# Patient Record
Sex: Male | Born: 1935 | Race: White | Hispanic: No | Marital: Married | Smoking: Never smoker
Health system: Southern US, Community
[De-identification: ages and names within clinical notes are randomized; demographics above are authoritative.]

## PROBLEM LIST (undated history)

## (undated) DIAGNOSIS — Z7901 Long term (current) use of anticoagulants: Secondary | ICD-10-CM

## (undated) DIAGNOSIS — K56609 Unspecified intestinal obstruction, unspecified as to partial versus complete obstruction: Secondary | ICD-10-CM

## (undated) DIAGNOSIS — Z8679 Personal history of other diseases of the circulatory system: Secondary | ICD-10-CM

## (undated) DIAGNOSIS — E785 Hyperlipidemia, unspecified: Secondary | ICD-10-CM

## (undated) DIAGNOSIS — I2510 Atherosclerotic heart disease of native coronary artery without angina pectoris: Secondary | ICD-10-CM

## (undated) DIAGNOSIS — R634 Abnormal weight loss: Secondary | ICD-10-CM

## (undated) DIAGNOSIS — N184 Chronic kidney disease, stage 4 (severe): Secondary | ICD-10-CM

## (undated) DIAGNOSIS — J189 Pneumonia, unspecified organism: Secondary | ICD-10-CM

## (undated) DIAGNOSIS — I272 Pulmonary hypertension, unspecified: Secondary | ICD-10-CM

## (undated) DIAGNOSIS — I482 Chronic atrial fibrillation, unspecified: Secondary | ICD-10-CM

## (undated) DIAGNOSIS — I509 Heart failure, unspecified: Secondary | ICD-10-CM

## (undated) DIAGNOSIS — I499 Cardiac arrhythmia, unspecified: Secondary | ICD-10-CM

## (undated) DIAGNOSIS — D649 Anemia, unspecified: Secondary | ICD-10-CM

## (undated) DIAGNOSIS — I10 Essential (primary) hypertension: Secondary | ICD-10-CM

## (undated) HISTORY — DX: Chronic kidney disease, stage 4 (severe): N18.4

## (undated) HISTORY — PX: EXTERNAL EAR SURGERY: SHX627

## (undated) HISTORY — DX: Unspecified intestinal obstruction, unspecified as to partial versus complete obstruction: K56.609

## (undated) HISTORY — DX: Chronic atrial fibrillation, unspecified: I48.20

## (undated) HISTORY — DX: Abnormal weight loss: R63.4

## (undated) HISTORY — DX: Atherosclerotic heart disease of native coronary artery without angina pectoris: I25.10

## (undated) HISTORY — DX: Heart failure, unspecified: I50.9

## (undated) HISTORY — DX: Hyperlipidemia, unspecified: E78.5

## (undated) HISTORY — DX: Anemia, unspecified: D64.9

## (undated) HISTORY — PX: CHOLECYSTECTOMY: SHX55

## (undated) HISTORY — DX: Long term (current) use of anticoagulants: Z79.01

## (undated) HISTORY — DX: Personal history of other diseases of the circulatory system: Z86.79

## (undated) HISTORY — DX: Essential (primary) hypertension: I10

## (undated) HISTORY — DX: Pulmonary hypertension, unspecified: I27.20

---

## 1997-06-02 ENCOUNTER — Other Ambulatory Visit: Admission: RE | Admit: 1997-06-02 | Discharge: 1997-06-02 | Payer: Self-pay | Admitting: Family Medicine

## 1997-07-13 ENCOUNTER — Other Ambulatory Visit: Admission: RE | Admit: 1997-07-13 | Discharge: 1997-07-13 | Payer: Self-pay | Admitting: Family Medicine

## 1998-06-24 ENCOUNTER — Ambulatory Visit (HOSPITAL_COMMUNITY): Admission: RE | Admit: 1998-06-24 | Discharge: 1998-06-25 | Payer: Self-pay | Admitting: Ophthalmology

## 1998-06-24 ENCOUNTER — Encounter: Payer: Self-pay | Admitting: Ophthalmology

## 1999-01-03 HISTORY — PX: CORONARY ARTERY BYPASS GRAFT: SHX141

## 1999-03-31 ENCOUNTER — Ambulatory Visit (HOSPITAL_COMMUNITY): Admission: RE | Admit: 1999-03-31 | Discharge: 1999-03-31 | Payer: Self-pay | Admitting: Cardiology

## 1999-03-31 HISTORY — PX: CARDIAC CATHETERIZATION: SHX172

## 1999-04-07 ENCOUNTER — Encounter: Payer: Self-pay | Admitting: Surgery

## 1999-04-11 ENCOUNTER — Inpatient Hospital Stay (HOSPITAL_COMMUNITY): Admission: RE | Admit: 1999-04-11 | Discharge: 1999-04-19 | Payer: Self-pay | Admitting: Surgery

## 1999-04-11 ENCOUNTER — Encounter: Payer: Self-pay | Admitting: Surgery

## 1999-04-12 ENCOUNTER — Encounter: Payer: Self-pay | Admitting: Surgery

## 1999-04-13 ENCOUNTER — Encounter: Payer: Self-pay | Admitting: Surgery

## 2000-02-08 ENCOUNTER — Emergency Department (HOSPITAL_COMMUNITY): Admission: EM | Admit: 2000-02-08 | Discharge: 2000-02-08 | Payer: Self-pay

## 2000-02-08 ENCOUNTER — Encounter: Payer: Self-pay | Admitting: Emergency Medicine

## 2000-10-30 ENCOUNTER — Emergency Department (HOSPITAL_COMMUNITY): Admission: EM | Admit: 2000-10-30 | Discharge: 2000-10-30 | Payer: Self-pay | Admitting: Emergency Medicine

## 2003-04-23 ENCOUNTER — Emergency Department (HOSPITAL_COMMUNITY): Admission: EM | Admit: 2003-04-23 | Discharge: 2003-04-24 | Payer: Self-pay | Admitting: Emergency Medicine

## 2003-08-27 ENCOUNTER — Emergency Department (HOSPITAL_COMMUNITY): Admission: EM | Admit: 2003-08-27 | Discharge: 2003-08-28 | Payer: Self-pay | Admitting: Emergency Medicine

## 2003-09-21 ENCOUNTER — Ambulatory Visit: Payer: Self-pay | Admitting: Nurse Practitioner

## 2003-10-26 ENCOUNTER — Ambulatory Visit: Payer: Self-pay | Admitting: Nurse Practitioner

## 2003-11-30 ENCOUNTER — Ambulatory Visit: Payer: Self-pay | Admitting: Nurse Practitioner

## 2003-12-11 ENCOUNTER — Emergency Department (HOSPITAL_COMMUNITY): Admission: EM | Admit: 2003-12-11 | Discharge: 2003-12-11 | Payer: Self-pay | Admitting: Emergency Medicine

## 2003-12-30 ENCOUNTER — Ambulatory Visit: Payer: Self-pay | Admitting: Family Medicine

## 2004-01-27 ENCOUNTER — Ambulatory Visit: Payer: Self-pay | Admitting: Nurse Practitioner

## 2004-02-24 ENCOUNTER — Ambulatory Visit: Payer: Self-pay | Admitting: Nurse Practitioner

## 2004-03-23 ENCOUNTER — Ambulatory Visit: Payer: Self-pay | Admitting: Nurse Practitioner

## 2004-03-30 ENCOUNTER — Ambulatory Visit: Payer: Self-pay | Admitting: Family Medicine

## 2004-04-21 ENCOUNTER — Ambulatory Visit: Payer: Self-pay | Admitting: Nurse Practitioner

## 2004-04-26 ENCOUNTER — Ambulatory Visit: Payer: Self-pay | Admitting: Nurse Practitioner

## 2004-05-19 ENCOUNTER — Ambulatory Visit: Payer: Self-pay | Admitting: Nurse Practitioner

## 2004-06-20 ENCOUNTER — Ambulatory Visit: Payer: Self-pay | Admitting: Nurse Practitioner

## 2004-07-20 ENCOUNTER — Ambulatory Visit: Payer: Self-pay | Admitting: Nurse Practitioner

## 2004-07-26 ENCOUNTER — Ambulatory Visit: Payer: Self-pay | Admitting: Nurse Practitioner

## 2004-08-22 ENCOUNTER — Ambulatory Visit: Payer: Self-pay | Admitting: Nurse Practitioner

## 2004-08-29 ENCOUNTER — Ambulatory Visit: Payer: Self-pay | Admitting: Internal Medicine

## 2004-09-21 HISTORY — PX: US ECHOCARDIOGRAPHY: HXRAD669

## 2004-09-22 HISTORY — PX: CARDIOVASCULAR STRESS TEST: SHX262

## 2004-09-26 ENCOUNTER — Ambulatory Visit: Payer: Self-pay | Admitting: Internal Medicine

## 2004-10-13 ENCOUNTER — Ambulatory Visit: Payer: Self-pay | Admitting: Nurse Practitioner

## 2004-11-10 ENCOUNTER — Ambulatory Visit: Payer: Self-pay | Admitting: Nurse Practitioner

## 2004-11-23 ENCOUNTER — Ambulatory Visit: Payer: Self-pay | Admitting: Internal Medicine

## 2004-11-29 ENCOUNTER — Ambulatory Visit: Payer: Self-pay | Admitting: Nurse Practitioner

## 2004-12-14 ENCOUNTER — Ambulatory Visit: Payer: Self-pay | Admitting: Nurse Practitioner

## 2004-12-16 ENCOUNTER — Emergency Department (HOSPITAL_COMMUNITY): Admission: EM | Admit: 2004-12-16 | Discharge: 2004-12-16 | Payer: Self-pay | Admitting: Emergency Medicine

## 2004-12-22 ENCOUNTER — Ambulatory Visit: Payer: Self-pay | Admitting: Nurse Practitioner

## 2004-12-29 ENCOUNTER — Ambulatory Visit: Payer: Self-pay | Admitting: Internal Medicine

## 2005-01-09 ENCOUNTER — Ambulatory Visit: Payer: Self-pay | Admitting: Nurse Practitioner

## 2005-02-07 ENCOUNTER — Ambulatory Visit: Payer: Self-pay | Admitting: Nurse Practitioner

## 2005-03-07 ENCOUNTER — Ambulatory Visit: Payer: Self-pay | Admitting: Nurse Practitioner

## 2005-04-05 ENCOUNTER — Ambulatory Visit: Payer: Self-pay | Admitting: Nurse Practitioner

## 2005-04-26 ENCOUNTER — Ambulatory Visit: Payer: Self-pay | Admitting: Nurse Practitioner

## 2005-05-25 ENCOUNTER — Ambulatory Visit: Payer: Self-pay | Admitting: Family Medicine

## 2005-06-02 ENCOUNTER — Encounter (INDEPENDENT_AMBULATORY_CARE_PROVIDER_SITE_OTHER): Payer: Self-pay | Admitting: Nurse Practitioner

## 2005-06-02 LAB — CONVERTED CEMR LAB: PSA: 0.68 ng/mL

## 2005-06-22 ENCOUNTER — Ambulatory Visit: Payer: Self-pay | Admitting: Nurse Practitioner

## 2005-07-24 ENCOUNTER — Ambulatory Visit: Payer: Self-pay | Admitting: Nurse Practitioner

## 2005-07-31 ENCOUNTER — Ambulatory Visit: Payer: Self-pay | Admitting: Nurse Practitioner

## 2005-08-23 ENCOUNTER — Ambulatory Visit: Payer: Self-pay | Admitting: Nurse Practitioner

## 2005-09-20 ENCOUNTER — Ambulatory Visit: Payer: Self-pay | Admitting: Nurse Practitioner

## 2005-10-18 ENCOUNTER — Ambulatory Visit: Payer: Self-pay | Admitting: Nurse Practitioner

## 2005-11-15 ENCOUNTER — Ambulatory Visit: Payer: Self-pay | Admitting: Nurse Practitioner

## 2005-12-13 ENCOUNTER — Ambulatory Visit: Payer: Self-pay | Admitting: Nurse Practitioner

## 2006-01-15 ENCOUNTER — Ambulatory Visit: Payer: Self-pay | Admitting: Nurse Practitioner

## 2006-02-13 ENCOUNTER — Ambulatory Visit: Payer: Self-pay | Admitting: Nurse Practitioner

## 2006-03-14 ENCOUNTER — Ambulatory Visit: Payer: Self-pay | Admitting: Nurse Practitioner

## 2006-03-28 ENCOUNTER — Ambulatory Visit: Payer: Self-pay | Admitting: Nurse Practitioner

## 2006-04-26 ENCOUNTER — Ambulatory Visit: Payer: Self-pay | Admitting: Nurse Practitioner

## 2006-05-08 ENCOUNTER — Ambulatory Visit: Payer: Self-pay | Admitting: Nurse Practitioner

## 2006-06-05 ENCOUNTER — Ambulatory Visit: Payer: Self-pay | Admitting: Internal Medicine

## 2006-06-11 ENCOUNTER — Encounter (INDEPENDENT_AMBULATORY_CARE_PROVIDER_SITE_OTHER): Payer: Self-pay | Admitting: Nurse Practitioner

## 2006-06-11 DIAGNOSIS — I4891 Unspecified atrial fibrillation: Secondary | ICD-10-CM

## 2006-06-11 DIAGNOSIS — I2510 Atherosclerotic heart disease of native coronary artery without angina pectoris: Secondary | ICD-10-CM | POA: Insufficient documentation

## 2006-06-11 DIAGNOSIS — D509 Iron deficiency anemia, unspecified: Secondary | ICD-10-CM

## 2006-06-11 DIAGNOSIS — F4310 Post-traumatic stress disorder, unspecified: Secondary | ICD-10-CM | POA: Insufficient documentation

## 2006-06-11 DIAGNOSIS — I10 Essential (primary) hypertension: Secondary | ICD-10-CM | POA: Insufficient documentation

## 2006-06-11 DIAGNOSIS — Z951 Presence of aortocoronary bypass graft: Secondary | ICD-10-CM

## 2006-06-11 DIAGNOSIS — F411 Generalized anxiety disorder: Secondary | ICD-10-CM

## 2006-06-11 DIAGNOSIS — E7800 Pure hypercholesterolemia, unspecified: Secondary | ICD-10-CM

## 2006-06-12 ENCOUNTER — Ambulatory Visit: Payer: Self-pay | Admitting: Family Medicine

## 2006-06-19 ENCOUNTER — Ambulatory Visit: Payer: Self-pay | Admitting: Family Medicine

## 2006-07-03 ENCOUNTER — Ambulatory Visit: Payer: Self-pay | Admitting: Internal Medicine

## 2006-07-31 ENCOUNTER — Ambulatory Visit: Payer: Self-pay | Admitting: Nurse Practitioner

## 2006-08-28 ENCOUNTER — Ambulatory Visit: Payer: Self-pay | Admitting: Nurse Practitioner

## 2006-09-11 ENCOUNTER — Ambulatory Visit: Payer: Self-pay | Admitting: Nurse Practitioner

## 2006-09-12 ENCOUNTER — Ambulatory Visit: Payer: Self-pay | Admitting: Nurse Practitioner

## 2006-10-09 ENCOUNTER — Ambulatory Visit: Payer: Self-pay | Admitting: Nurse Practitioner

## 2006-11-08 ENCOUNTER — Ambulatory Visit: Payer: Self-pay | Admitting: Nurse Practitioner

## 2006-12-04 ENCOUNTER — Ambulatory Visit: Payer: Self-pay | Admitting: Nurse Practitioner

## 2007-01-01 ENCOUNTER — Ambulatory Visit: Payer: Self-pay | Admitting: Nurse Practitioner

## 2007-01-31 ENCOUNTER — Ambulatory Visit: Payer: Self-pay | Admitting: Family Medicine

## 2007-01-31 ENCOUNTER — Encounter (INDEPENDENT_AMBULATORY_CARE_PROVIDER_SITE_OTHER): Payer: Self-pay | Admitting: Nurse Practitioner

## 2007-01-31 LAB — CONVERTED CEMR LAB
Alkaline Phosphatase: 68 units/L (ref 39–117)
BUN: 21 mg/dL (ref 6–23)
Digitoxin Lvl: 0.8 ng/mL (ref 0.8–2.0)
Eosinophils Absolute: 0.3 10*3/uL (ref 0.0–0.7)
Eosinophils Relative: 4 % (ref 0–5)
HCT: 38.1 % — ABNORMAL LOW (ref 39.0–52.0)
LDL Cholesterol: 74 mg/dL (ref 0–99)
Lymphs Abs: 0.8 10*3/uL (ref 0.7–4.0)
Monocytes Absolute: 0.5 10*3/uL (ref 0.1–1.0)
Monocytes Relative: 8 % (ref 3–12)
Neutro Abs: 4.6 10*3/uL (ref 1.7–7.7)
Neutrophils Relative %: 75 % (ref 43–77)
PSA: 0.64 ng/mL (ref 0.10–4.00)
RBC: 3.94 M/uL — ABNORMAL LOW (ref 4.22–5.81)
Sodium: 142 meq/L (ref 135–145)
TSH: 1.919 microintl units/mL (ref 0.350–5.50)
Total CHOL/HDL Ratio: 3
Total Protein: 7.1 g/dL (ref 6.0–8.3)
VLDL: 18 mg/dL (ref 0–40)
WBC: 6.1 10*3/uL (ref 4.0–10.5)

## 2007-02-04 ENCOUNTER — Encounter (INDEPENDENT_AMBULATORY_CARE_PROVIDER_SITE_OTHER): Payer: Self-pay | Admitting: Nurse Practitioner

## 2007-02-04 LAB — CONVERTED CEMR LAB
Ferritin: 34 ng/mL (ref 22–322)
Iron: 59 ug/dL (ref 42–165)
Saturation Ratios: 17 % — ABNORMAL LOW (ref 20–55)
TIBC: 357 ug/dL (ref 215–435)
UIBC: 298 ug/dL

## 2007-02-27 ENCOUNTER — Ambulatory Visit: Payer: Self-pay | Admitting: Nurse Practitioner

## 2007-03-27 ENCOUNTER — Ambulatory Visit: Payer: Self-pay | Admitting: Internal Medicine

## 2007-04-09 ENCOUNTER — Ambulatory Visit: Payer: Self-pay | Admitting: Internal Medicine

## 2007-04-24 ENCOUNTER — Ambulatory Visit: Payer: Self-pay | Admitting: Nurse Practitioner

## 2007-05-08 ENCOUNTER — Ambulatory Visit: Payer: Self-pay | Admitting: Nurse Practitioner

## 2007-05-21 ENCOUNTER — Ambulatory Visit: Payer: Self-pay | Admitting: Internal Medicine

## 2007-05-21 ENCOUNTER — Encounter (INDEPENDENT_AMBULATORY_CARE_PROVIDER_SITE_OTHER): Payer: Self-pay | Admitting: Nurse Practitioner

## 2007-05-21 LAB — CONVERTED CEMR LAB
ALT: 14 units/L (ref 0–53)
BUN: 28 mg/dL — ABNORMAL HIGH (ref 6–23)
Basophils Absolute: 0.1 10*3/uL (ref 0.0–0.1)
CO2: 29 meq/L (ref 19–32)
Calcium: 9.3 mg/dL (ref 8.4–10.5)
Chloride: 103 meq/L (ref 96–112)
Creatinine, Ser: 1.8 mg/dL — ABNORMAL HIGH (ref 0.40–1.50)
Eosinophils Absolute: 0.4 10*3/uL (ref 0.0–0.7)
Eosinophils Relative: 6 % — ABNORMAL HIGH (ref 0–5)
HDL: 52 mg/dL (ref >39)
LDL Cholesterol: 77 mg/dL (ref 0–99)
MCV: 98.8 fL (ref 78.0–100.0)
Monocytes Relative: 10 % (ref 3–12)
Total Protein: 7.6 g/dL (ref 6.0–8.3)
Triglycerides: 85 mg/dL (ref <150)
VLDL: 17 mg/dL (ref 0–40)

## 2007-05-31 HISTORY — PX: US ECHOCARDIOGRAPHY: HXRAD669

## 2007-06-02 ENCOUNTER — Emergency Department (HOSPITAL_COMMUNITY): Admission: EM | Admit: 2007-06-02 | Discharge: 2007-06-02 | Payer: Self-pay | Admitting: Emergency Medicine

## 2007-06-06 ENCOUNTER — Ambulatory Visit: Payer: Self-pay | Admitting: Internal Medicine

## 2007-06-10 ENCOUNTER — Ambulatory Visit: Payer: Self-pay | Admitting: Internal Medicine

## 2007-06-19 ENCOUNTER — Ambulatory Visit: Payer: Self-pay | Admitting: Nurse Practitioner

## 2007-07-17 ENCOUNTER — Ambulatory Visit: Payer: Self-pay | Admitting: Internal Medicine

## 2007-08-14 ENCOUNTER — Ambulatory Visit: Payer: Self-pay | Admitting: Internal Medicine

## 2007-09-10 ENCOUNTER — Ambulatory Visit: Payer: Self-pay | Admitting: Family Medicine

## 2007-09-10 LAB — CONVERTED CEMR LAB
INR: 2.6 — ABNORMAL HIGH (ref 0.0–1.5)
Prothrombin Time: 30 s — ABNORMAL HIGH (ref 11.6–15.2)

## 2007-10-08 ENCOUNTER — Encounter (INDEPENDENT_AMBULATORY_CARE_PROVIDER_SITE_OTHER): Payer: Self-pay | Admitting: Family Medicine

## 2007-10-08 ENCOUNTER — Ambulatory Visit: Payer: Self-pay | Admitting: Internal Medicine

## 2007-11-05 ENCOUNTER — Ambulatory Visit: Payer: Self-pay | Admitting: Family Medicine

## 2007-11-05 LAB — CONVERTED CEMR LAB
INR: 2.8 — ABNORMAL HIGH (ref 0.0–1.5)
Prothrombin Time: 31.9 s — ABNORMAL HIGH (ref 11.6–15.2)

## 2007-12-03 ENCOUNTER — Ambulatory Visit: Payer: Self-pay | Admitting: Internal Medicine

## 2007-12-03 LAB — CONVERTED CEMR LAB: Prothrombin Time: 30.5 s — ABNORMAL HIGH (ref 11.6–15.2)

## 2007-12-16 ENCOUNTER — Ambulatory Visit: Payer: Self-pay | Admitting: Cardiology

## 2007-12-16 ENCOUNTER — Inpatient Hospital Stay (HOSPITAL_COMMUNITY): Admission: EM | Admit: 2007-12-16 | Discharge: 2007-12-24 | Payer: Self-pay | Admitting: Emergency Medicine

## 2007-12-17 ENCOUNTER — Encounter: Payer: Self-pay | Admitting: Cardiology

## 2007-12-17 HISTORY — PX: TRANSTHORACIC ECHOCARDIOGRAM: SHX275

## 2008-02-10 ENCOUNTER — Observation Stay (HOSPITAL_COMMUNITY): Admission: EM | Admit: 2008-02-10 | Discharge: 2008-02-11 | Payer: Self-pay | Admitting: Emergency Medicine

## 2008-02-10 ENCOUNTER — Encounter (INDEPENDENT_AMBULATORY_CARE_PROVIDER_SITE_OTHER): Payer: Self-pay | Admitting: Adult Health

## 2008-02-10 ENCOUNTER — Ambulatory Visit: Payer: Self-pay | Admitting: Internal Medicine

## 2008-02-10 LAB — CONVERTED CEMR LAB
AST: 27 units/L (ref 0–37)
Albumin: 4.2 g/dL (ref 3.5–5.2)
BUN: 63 mg/dL — ABNORMAL HIGH (ref 6–23)
CO2: 22 meq/L (ref 19–32)
Calcium: 10 mg/dL (ref 8.4–10.5)
Chloride: 103 meq/L (ref 96–112)
Creatinine, Ser: 2.72 mg/dL — ABNORMAL HIGH (ref 0.40–1.50)
Glucose, Bld: 108 mg/dL — ABNORMAL HIGH (ref 70–99)

## 2009-01-07 ENCOUNTER — Ambulatory Visit: Payer: Self-pay | Admitting: Internal Medicine

## 2009-01-07 ENCOUNTER — Encounter (INDEPENDENT_AMBULATORY_CARE_PROVIDER_SITE_OTHER): Payer: Self-pay | Admitting: Family Medicine

## 2009-01-07 LAB — CONVERTED CEMR LAB
BUN: 35 mg/dL — ABNORMAL HIGH (ref 6–23)
Basophils Relative: 1 % (ref 0–1)
CO2: 23 meq/L (ref 19–32)
Calcium: 8.7 mg/dL (ref 8.4–10.5)
Chloride: 108 meq/L (ref 96–112)
Creatinine, Ser: 2.06 mg/dL — ABNORMAL HIGH (ref 0.40–1.50)
Glucose, Bld: 61 mg/dL — ABNORMAL LOW (ref 70–99)
Hemoglobin: 11.1 g/dL — ABNORMAL LOW (ref 13.0–17.0)
Lymphs Abs: 1.1 10*3/uL (ref 0.7–4.0)
Monocytes Absolute: 0.5 10*3/uL (ref 0.1–1.0)
RBC: 3.41 M/uL — ABNORMAL LOW (ref 4.22–5.81)
TIBC: 283 ug/dL (ref 215–435)
WBC: 5.4 10*3/uL (ref 4.0–10.5)

## 2009-01-11 ENCOUNTER — Encounter (INDEPENDENT_AMBULATORY_CARE_PROVIDER_SITE_OTHER): Payer: Self-pay | Admitting: Family Medicine

## 2009-01-11 LAB — CONVERTED CEMR LAB: Insulin: 3 microintl units/mL (ref 3–28)

## 2009-01-18 ENCOUNTER — Ambulatory Visit: Payer: Self-pay | Admitting: Family Medicine

## 2009-02-08 ENCOUNTER — Ambulatory Visit: Payer: Self-pay | Admitting: Internal Medicine

## 2009-02-08 ENCOUNTER — Encounter (INDEPENDENT_AMBULATORY_CARE_PROVIDER_SITE_OTHER): Payer: Self-pay | Admitting: Family Medicine

## 2009-02-08 LAB — CONVERTED CEMR LAB: Erythropoietin: 26.7 milliintl units/mL (ref 2.6–34.0)

## 2009-02-16 ENCOUNTER — Ambulatory Visit (HOSPITAL_COMMUNITY): Admission: RE | Admit: 2009-02-16 | Discharge: 2009-02-16 | Payer: Self-pay | Admitting: Family Medicine

## 2009-06-11 ENCOUNTER — Ambulatory Visit: Payer: Self-pay | Admitting: Infectious Diseases

## 2009-06-11 ENCOUNTER — Encounter: Payer: Self-pay | Admitting: Internal Medicine

## 2009-06-11 ENCOUNTER — Inpatient Hospital Stay (HOSPITAL_COMMUNITY): Admission: EM | Admit: 2009-06-11 | Discharge: 2009-06-14 | Payer: Self-pay | Admitting: Emergency Medicine

## 2009-06-14 ENCOUNTER — Encounter: Payer: Self-pay | Admitting: Internal Medicine

## 2009-06-14 DIAGNOSIS — K56609 Unspecified intestinal obstruction, unspecified as to partial versus complete obstruction: Secondary | ICD-10-CM | POA: Insufficient documentation

## 2009-06-14 DIAGNOSIS — R634 Abnormal weight loss: Secondary | ICD-10-CM | POA: Insufficient documentation

## 2009-06-22 ENCOUNTER — Ambulatory Visit: Payer: Self-pay | Admitting: Internal Medicine

## 2009-06-22 LAB — CONVERTED CEMR LAB
Eosinophils Relative: 6 % — ABNORMAL HIGH (ref 0–5)
Hemoglobin: 10.9 g/dL — ABNORMAL LOW (ref 13.0–17.0)
Iron: 83 ug/dL (ref 42–165)
Lymphs Abs: 1.2 10*3/uL (ref 0.7–4.0)
MCHC: 31.7 g/dL (ref 30.0–36.0)
MCV: 98.9 fL (ref 78.0–100.0)
Neutrophils Relative %: 61 % (ref 43–77)
Platelets: 160 10*3/uL (ref 150–400)
RBC: 3.48 M/uL — ABNORMAL LOW (ref 4.22–5.81)
RDW: 15.5 % (ref 11.5–15.5)
TIBC: 281 ug/dL (ref 215–435)

## 2009-07-28 ENCOUNTER — Ambulatory Visit: Payer: Self-pay | Admitting: Internal Medicine

## 2009-08-04 ENCOUNTER — Ambulatory Visit: Payer: Self-pay | Admitting: Cardiology

## 2009-08-18 ENCOUNTER — Ambulatory Visit: Payer: Self-pay | Admitting: Cardiology

## 2009-09-24 ENCOUNTER — Ambulatory Visit: Payer: Self-pay | Admitting: Cardiology

## 2009-10-22 ENCOUNTER — Ambulatory Visit: Payer: Self-pay | Admitting: Cardiology

## 2009-11-26 ENCOUNTER — Ambulatory Visit: Payer: Self-pay | Admitting: Cardiovascular Disease

## 2009-12-29 ENCOUNTER — Ambulatory Visit: Payer: Self-pay | Admitting: Cardiology

## 2010-01-13 ENCOUNTER — Ambulatory Visit: Payer: Self-pay | Admitting: Cardiology

## 2010-02-03 NOTE — Miscellaneous (Signed)
Summary: Office Visit ([facility])    Visit Type:  f/u visit   History of Present Illness: Was told by Cardiologist that he had low hemoglobin.  Pt has not been here for 11 months. In the meantime, he has seen Cardiology- Dr. Peter [place] and here for Coumadin checks.       Allergies: No Known Drug Allergies    Physical Exam  General:  Well-developed,well-nourished,in no acute distress; alert,appropriate and cooperative throughout examination Eyes:  No corneal or conjunctival inflammation noted. EOMI. Perrla.  Ears:  External ear exam shows no significant lesions or deformities.  Otoscopic examination reveals clear canals, tympanic membranes are intact bilaterally without bulging, retraction, inflammation or discharge. Hearing is grossly normal bilaterally. Nose:  External nasal examination shows no deformity or inflammation. Nasal mucosa are pink and moist without lesions or exudates. Mouth:  Oral mucosa and oropharynx without lesions or exudates.   Lungs:  Normal respiratory effort, chest expands symmetrically. Lungs are clear to auscultation, no crackles or wheezes. Heart:  Normal rate and regular rhythm. S1 and S2 normal without gallop, murmur, click, rub or other extra sounds. Abdomen:  Bowel sounds positive,abdomen soft and non-tender without masses, organomegaly or hernias noted.   Impression & Recommendations:  Problem # 1:  ANEMIA, IRON DEFICIENCY NOS (ICD-280.9) h/o. Currently here due to cardiologist told him to make appt due to anemia. Is chronically on coumadin. No bleeding noted. Anemia panel, CBC, Hemoccult x3. Obtain cardiology records.  Complete Medication List: 1)  Coumadin Tabs (Warfarin sodium tabs) .... Dose per pharmacy 2)  Atenolol 50 Mg Tabs (Atenolol) .Marland Kitchen.. 1 by mouth once daily 3)  Zocor 20 Mg Tabs (Simvastatin) .Marland Kitchen.. 1 by mouth once daily 4)  Digitek 0.125 Mg Tabs (Digoxin) .Marland Kitchen.. 1 by mouth once daily 5)  Monopril 20 Mg Tabs (Fosinopril sodium)  .Marland Kitchen.. 1 by mouth once daily 6)  Furosemide 20 Mg Tabs (Furosemide) .Marland Kitchen.. 1 by mouth once daily 7)  Lorazepam 0.5 Mg Tabs (Lorazepam) .Marland Kitchen.. 1 by mouth three times a day as needed for anxiety 8)  Clonidine Hcl 0.2 Mg Tabs (Clonidine hcl) .Marland Kitchen.. 1 by mouth two times a day 9)  Famotidine 20 Mg Tabs (Famotidine) .Marland Kitchen.. 1 by mouth once daily 10)  Multivitamin/iron Tabs (Multiple vitamins-iron) .... Once daily 11)  Ambien 5 Mg Tabs (Zolpidem tartrate) .Marland Kitchen.. 1 by mouth at bedtime as needed for sleep

## 2010-02-03 NOTE — Discharge Summary (Signed)
Summary: Hospital Discharge Update    Hospital Discharge Update:  Date of Admission: 06/11/2009 Date of Discharge: 06/14/2009  Brief Summary:  Patient is 77 yo man from [place] with limited english vocab with pmh of abdominal surgery in 1980 for ulcer perforation. He was admitted with nausea and severe abdominal pain which was thought to be due to SBO. He did not have any episodes of vomiting in the hospital and started accepting oral diet and without anydifficulty at discharge. He also has a h/o 20 kg weight loss which is documented by his wife with severe loss of appetite. Since his suspected obstruction was relieved we will discharge him today with an out patient follow up with GI. He will have to make an appointment with [facility], I called Dr Elnoria Howard and he did not schedule an appointment without patinet's contact number and insurance details before making an appointment.  Labs needed at follow-up: PT/INR  Other labs needed at follow-up: fecal occult blood test  Other follow-up issues:  patient has a history of 20 kg/40 pounds undocumented weight loss which needs to followed up with appropriate tests and possible GI worup.  Problem list changes:  Added new problem of LOSS OF WEIGHT (ICD-783.21) Added new problem of UNSPECIFIED INTESTINAL OBSTRUCTION (ICD-560.9)  The medication, problem, and allergy lists have been updated.  Please see the dictated discharge summary for details.   Other patient instructions:  Follow with Dr Reche Dixon on 06/22/2009, Tuesday at 9.15 AM. He is requested to set up an appointment for him in GI for evaluation of his dramatic weight loss. We tried to call [facility] and talked to Dr Elnoria Howard who is accepting unassigned patients this week but will not schedule an appointment without his contact and insurance details. I would ask the patient to call GI office at [phone] and make an appointment.

## 2010-02-03 NOTE — Miscellaneous (Signed)
Summary: Hospital Admission  INTERNAL MEDICINE ADMISSION HISTORY AND PHYSICAL  Attending: Dr, Lina Sayre R1: Dr. Eben Burow [phone]          R2: Dr. [phone]  PCP: [facility] (Dr. Reche Dixon)  CC: Abdominal pain, N/V  HPI: 77 y/o male with pmh significant for CAD (s/p CABG), A. fib, HTN, HLD and CRI; who comes to the ED complaining of abdominal pain and nausea. Patient reports started around 2:30 am, constant, no radiation, worsen by movement and relieved by morphine/fentanyl given in the ED. Patient denies vomiting, no diarrhea, last bowel movement day PTA (normal); Patient reports decrease appetite and also weight loss (45 pounds over 2 years). Patient has never been follow by GI and has hx of perforated peptic ulcer s/p surgery in 1980. CT of abdomen in the ED demonstrated mild distal small bowel dilatation and possible SBO.  Patient denies fever, chills, hematochezia, melena, cough, SOB, chest pain or any other complaints.  ALLERGIES: NKDA  PAST MEDICAL HISTORY: Patient does not speak [place] (from [place]). Hx of CAD s/p CABG A. fibrilation (on coumadin) HTN HLD Renal insuficiency (Cr baseline 2.1) GERD Hx of perforated PUD (s/p surgery in 1980) Hard of hearing  Anxiety Insomnia  MEDICATIONS: COUMADIN  TABS (WARFARIN SODIUM TABS) dose per pharmacy ATENOLOL 50 MG TABS (ATENOLOL) 1 by mouth once daily ZOCOR 20 MG TABS (SIMVASTATIN) 1 by mouth once daily DIGITEK 0.125 MG TABS (DIGOXIN) 1 by mouth once daily MONOPRIL 20 MG TABS (FOSINOPRIL SODIUM) 1 by mouth once daily FUROSEMIDE 20 MG TABS (FUROSEMIDE) 1 by mouth once daily LORAZEPAM 0.5 MG TABS (LORAZEPAM) 1 by mouth three times a day as needed for anxiety CLONIDINE HCL 0.2 MG TABS (CLONIDINE HCL) 1 by mouth two times a day FAMOTIDINE 20 MG TABS (FAMOTIDINE) 1 by mouth once daily MULTIVITAMIN/IRON  TABS (MULTIPLE VITAMINS-IRON) once daily AMBIEN 5 MG TABS (ZOLPIDEM TARTRATE) 1 by mouth at bedtime as needed for sleep   SOCIAL  HISTORY: Currently living with his wife at home (but wife reports she is unable to provide care on her own) Retired (was a Art gallery manager) Substance GNF:AOZHYQ illicit drugs and ETOH. Patient quit smoking in 1992. Insurance:Medicare  FAMILY HISTORY No contributory at his age; but no hx of stomach cancer in his family.   ROS: negative except for findings on HPI.  VITALS: T:  97.5    P: 52     BP: 134/60    R:20     O2SAT:97%    ON:RA  PHYSICAL EXAM: General:  alert, cachetic, and cooperative to examination.   Head:  normocephalic and atraumatic.   Eyes:  vision grossly intact, pupils equal, pupils round, pupils reactive to light, no injection and anicteric.   Mouth:  pharynx pink and moist, no erythema, and no exudates.   Neck:  supple, full ROM, no thyromegaly, no JVD, and no carotid bruits.   Lungs:  normal respiratory effort, no accessory muscle use, normal breath sounds, no crackles, and no wheezes.  Heart:  Irregular rhythm, normal rate, no M/G/R.   Abdomen:  soft, mild mid abdomen tenderness, decreased bowel sounds, no distention, no guarding, no rebound, no visceromegaly appreciated; positve bilat inguinal hernias reducible & non tender.   Msk:  no joint swelling, no joint warmth, and no redness over joints.   Pulses:  2+ DP/PT pulses bilaterally Extremities:  No cyanosis, clubbing, edema  Skin: No rash.  Neurologic: alert and oriented X3, hard of hearing, poor effort due to generalized weakness (MS 4/5 bilaterally), normal  sensation.    LABS: Lipase                                   28                11-59            U/L  Digoxin                                  1.3               0.8-2.0          ng/mL  Protime ( Prothrombin Time)              24.5       h      11.6-15.2         INR                                      2.23       h      0.00-1.49    Sodium (NA)                              139               135-145          mEq/L  Potassium (K)                            4.6                3.5-5.1          mEq/L  Chloride                                 106               96-112           mEq/L  CO2                                      26                19-32            mEq/L  Glucose                                  123        h      70-99            mg/dL  BUN                                      33         h      6-23             mg/dL  Creatinine  2.44       h      0.4-1.5          mg/dL  GFR, Est Non African American            26         l      >60              mL/min  GFR, Est African American                31         l      >60              mL/min  Bilirubin, Total                         1.3        h      0.3-1.2          mg/dL  Alkaline Phosphatase                     51                39-117           U/L  SGOT (AST)                               23                0-37             U/L  SGPT (ALT)                               18                0-53             U/L  Total  Protein                           7.2               6.0-8.3          g/dL  Albumin-Blood                            3.8               3.5-5.2          g/dL  Calcium                                  8.9               8.4-10.5         mg/dL     WBC                                      6.9               4.0-10.5         K/uL  RBC  3.40       l      4.22-5.81        MIL/uL  Hemoglobin (HGB)                         11.4       l      13.0-17.0        g/dL  Hematocrit (HCT)                         34.4       l      39.0-52.0        %  MCV                                      101.2      h      78.0-100.0       fL  MCHC                                     33.2              30.0-36.0        g/dL  RDW                                      14.9              11.5-15.5        %  Platelet Count (PLT)                     126        l      150-400          K/uL  Neutrophils, %                           73                43-77             %  Lymphocytes, %                           17                12-46            %  Monocytes, %                             4                 3-12             %  Eosinophils, %                           5                 0-5              %  Basophils, %  0                 0-1              %  Neutrophils, Absolute                    5.0               1.7-7.7          K/uL  Lymphocytes, Absolute                    1.2               0.7-4.0          K/uL  Monocytes, Absolute                      0.3               0.1-1.0          K/uL  Eosinophils, Absolute                    0.4               0.0-0.7          K/uL  Basophils, Absolute                      0.0               0.0-0.1          K/uL    CKMB, POC                                <1.0       l      1.0-8.0          ng/mL  Troponin I, POC                          <0.05             0.00-0.09        ng/mL  Myoglobin, POC                           120               12-200           ng/mL     Color, Urine                             YELLOW            YELLOW  Appearance                               CLEAR             CLEAR  Specific Gravity                         1.020             1.005-1.030  pH  5.0               5.0-8.0  Urine Glucose                            NEGATIVE          NEG              mg/dL  Bilirubin                                NEGATIVE          NEG  Ketones                                  NEGATIVE          NEG              mg/dL  Blood                                    NEGATIVE          NEG  Protein                                  NEGATIVE          NEG              mg/dL  Urobilinogen                             1.0               0.0-1.0          mg/dL  Nitrite                                  NEGATIVE          NEG  Leukocytes                               NEGATIVE          NEG    MICROSCOPIC NOT DONE ON URINES WITH NEGATIVE PROTEIN, BLOOD, LEUKOCYTES,     NITRITE, OR GLUCOSE <1000 mg/dL.  IMAGES studies CT abd and pelvis: IMPRESSION:Probable distal small bowel obstruction, etiology indeterminate. The patient has small inguinal hernias.  On the right, small intestine does approached the mouth of this hernia but does not grossly anterior.  On the left, it appears that there is some fluid in the hernia but I cannot establish that small intestine enters the hernia.  Freely distributed ascites probably related to a small bowel obstruction.  Left pleural effusion with atelectasis in the left lower lobe.  2.2 cm low density abnormality at the dome of the liver likely represent a cyst or hemangioma.  This is not fully characterized however.  Small gallstones without imaging evidence of cholecystitis or obstruction.  3.1 cm infrarenal abdominal aortic aneurysm.    CXR: IMPRESSION:Left lower lobe atelectasis or infection with adjacent small left pleural effusion. Recommend radiographic follow-up until clearing. Underlying hyperinflation likely  relates to COPD. Cardiomegaly.  Abdominal x-ray: 1.  Mildly degraded exam due to lack of upright film and exclusion   of the low pelvis. 2.  Borderline small bowel dilatation with minimal small bowel air   fluid levels on decubitus imaging.  Suspect mild adynamic ileus. No specific evidence to suggest obstruction.  ASSESSMENT AND PLAN:  (1) Abdominal pain and nausea: Differential includes, SBO Vs malignancy (especially with weight loss) Vs H. Pylori infection, Vs biliary colic Vs adhesions. Surgery has been consulted, will follow her recommendations. Will check lactic acid, to r/o (even unlikely) mesenteric ischemia; will check stool and serology for H. pylori; will also check CEA and Ca 19-9. Will admit to a regular bed, NPO, follow LFT's (especially bilirubin) and also to repeat abdominal films in the am. Patient will benefit of EGD and also colonoscopy. Will provide supportive care, antiemetics,  pain killers and protonix.  (2) Weakness/weight loss: Could be secondary to poor appetite 2/2 abdominal condition going on; also secondary to malignancy. Will also check TSH, B12 and Vit D level to be thorough.  (3) Acute on chronic renal insuficiency: Will hydrate him, will discontinue ACE's inhibitor and will follow Cr level. Renal US done in February demonstarting chronic changes to his renal parenchyma that is most likely due to HTN.  (4) HTN: Stable and well controlled. Will continue same regimen for now, except for his lisinopril due to Cr in 2.4 range.  (5) A. Fib: Rate controlled and on coumadin. will continue coumadin but with a therapeutic goal of 1.7-1.8 anticipating GI workup and surgical exploration if needed. Will continue digoxin.  (6) HLD: Will continue Zocor daily and will check lipid profile.  (7) GERD: Will start protonix. Will check for H. pylori infection and will provide treatment if needed.  (8) Bilateral inguinal hernia: reducible, no incarcerated, will continue observation for now, no treatment required.  (9) Anxiety: Will continue as needed treatment with lorazepam as previously taken as an outpatient.  (10) VTE prophylaxis: Continue coumadin, early ambulation. If dose changes needed will start SCD's.

## 2010-02-08 ENCOUNTER — Encounter (INDEPENDENT_AMBULATORY_CARE_PROVIDER_SITE_OTHER): Payer: Medicare Other

## 2010-02-08 DIAGNOSIS — I4891 Unspecified atrial fibrillation: Secondary | ICD-10-CM

## 2010-02-08 DIAGNOSIS — Z7901 Long term (current) use of anticoagulants: Secondary | ICD-10-CM

## 2010-03-08 ENCOUNTER — Other Ambulatory Visit (INDEPENDENT_AMBULATORY_CARE_PROVIDER_SITE_OTHER): Payer: Medicare Other

## 2010-03-08 DIAGNOSIS — Z7901 Long term (current) use of anticoagulants: Secondary | ICD-10-CM

## 2010-03-08 DIAGNOSIS — I4891 Unspecified atrial fibrillation: Secondary | ICD-10-CM

## 2010-03-21 ENCOUNTER — Ambulatory Visit (INDEPENDENT_AMBULATORY_CARE_PROVIDER_SITE_OTHER): Payer: Medicare Other | Admitting: Cardiology

## 2010-03-21 DIAGNOSIS — I2510 Atherosclerotic heart disease of native coronary artery without angina pectoris: Secondary | ICD-10-CM

## 2010-03-21 DIAGNOSIS — Z951 Presence of aortocoronary bypass graft: Secondary | ICD-10-CM

## 2010-03-21 DIAGNOSIS — I4891 Unspecified atrial fibrillation: Secondary | ICD-10-CM

## 2010-03-21 DIAGNOSIS — I509 Heart failure, unspecified: Secondary | ICD-10-CM

## 2010-03-21 LAB — DIFFERENTIAL
Basophils Absolute: 0 10*3/uL (ref 0.0–0.1)
Lymphs Abs: 1.2 10*3/uL (ref 0.7–4.0)
Monocytes Absolute: 0.3 10*3/uL (ref 0.1–1.0)
Neutrophils Relative %: 73 % (ref 43–77)

## 2010-03-21 LAB — CBC
HCT: 30.4 % — ABNORMAL LOW (ref 39.0–52.0)
HCT: 30.4 % — ABNORMAL LOW (ref 39.0–52.0)
HCT: 31.5 % — ABNORMAL LOW (ref 39.0–52.0)
Hemoglobin: 10.3 g/dL — ABNORMAL LOW (ref 13.0–17.0)
Hemoglobin: 10.7 g/dL — ABNORMAL LOW (ref 13.0–17.0)
Hemoglobin: 11.4 g/dL — ABNORMAL LOW (ref 13.0–17.0)
MCHC: 33.2 g/dL (ref 30.0–36.0)
MCHC: 33.8 g/dL (ref 30.0–36.0)
MCHC: 33.9 g/dL (ref 30.0–36.0)
MCV: 101.2 fL — ABNORMAL HIGH (ref 78.0–100.0)
MCV: 96.3 fL (ref 78.0–100.0)
MCV: 96.5 fL (ref 78.0–100.0)
Platelets: 126 10*3/uL — ABNORMAL LOW (ref 150–400)
RBC: 3.14 MIL/uL — ABNORMAL LOW (ref 4.22–5.81)
RBC: 3.16 MIL/uL — ABNORMAL LOW (ref 4.22–5.81)
RBC: 3.26 MIL/uL — ABNORMAL LOW (ref 4.22–5.81)
RDW: 14.9 % (ref 11.5–15.5)
WBC: 5.4 10*3/uL (ref 4.0–10.5)
WBC: 6.7 10*3/uL (ref 4.0–10.5)

## 2010-03-21 LAB — POCT CARDIAC MARKERS
CKMB, poc: <1 ng/mL — ABNORMAL LOW (ref 1.0–8.0)
Myoglobin, poc: 120 ng/mL (ref 12–200)
Troponin i, poc: <0.05 ng/mL (ref 0.00–0.09)

## 2010-03-21 LAB — LIPID PANEL
Cholesterol: 124 mg/dL (ref 0–200)
LDL Cholesterol: 59 mg/dL (ref 0–99)
VLDL: 17 mg/dL (ref 0–40)

## 2010-03-21 LAB — BASIC METABOLIC PANEL
BUN: 25 mg/dL — ABNORMAL HIGH (ref 6–23)
CO2: 25 mEq/L (ref 19–32)
Calcium: 8.1 mg/dL — ABNORMAL LOW (ref 8.4–10.5)
Chloride: 111 mEq/L (ref 96–112)
GFR calc Af Amer: 37 mL/min — ABNORMAL LOW (ref >60)
GFR calc Af Amer: 38 mL/min — ABNORMAL LOW (ref >60)
Potassium: 4.2 mEq/L (ref 3.5–5.1)
Potassium: 4.5 mEq/L (ref 3.5–5.1)
Sodium: 137 mEq/L (ref 135–145)

## 2010-03-21 LAB — H. PYLORI ANTIBODY, IGG: H Pylori IgG: 0.8 {ISR}

## 2010-03-21 LAB — VITAMIN B12: Vitamin B-12: 671 pg/mL (ref 211–911)

## 2010-03-21 LAB — COMPREHENSIVE METABOLIC PANEL
ALT: 18 U/L (ref 0–53)
Albumin: 2.9 g/dL — ABNORMAL LOW (ref 3.5–5.2)
Alkaline Phosphatase: 44 U/L (ref 39–117)
BUN: 28 mg/dL — ABNORMAL HIGH (ref 6–23)
BUN: 33 mg/dL — ABNORMAL HIGH (ref 6–23)
CO2: 27 mEq/L (ref 19–32)
Calcium: 8.9 mg/dL (ref 8.4–10.5)
Chloride: 106 mEq/L (ref 96–112)
Chloride: 107 mEq/L (ref 96–112)
Creatinine, Ser: 2.09 mg/dL — ABNORMAL HIGH (ref 0.4–1.5)
Creatinine, Ser: 2.44 mg/dL — ABNORMAL HIGH (ref 0.4–1.5)
GFR calc Af Amer: 31 mL/min — ABNORMAL LOW (ref >60)
GFR calc non Af Amer: 31 mL/min — ABNORMAL LOW (ref >60)
Glucose, Bld: 76 mg/dL (ref 70–99)
Potassium: 4.8 mEq/L (ref 3.5–5.1)
Total Bilirubin: 1.2 mg/dL (ref 0.3–1.2)
Total Bilirubin: 1.3 mg/dL — ABNORMAL HIGH (ref 0.3–1.2)

## 2010-03-21 LAB — PROTIME-INR
INR: 2.78 — ABNORMAL HIGH (ref 0.00–1.49)
Prothrombin Time: 24.5 seconds — ABNORMAL HIGH (ref 11.6–15.2)

## 2010-03-21 LAB — LIPASE, BLOOD: Lipase: 28 U/L (ref 11–59)

## 2010-03-21 LAB — URINALYSIS, ROUTINE W REFLEX MICROSCOPIC
Bilirubin Urine: NEGATIVE
Hgb urine dipstick: NEGATIVE
Nitrite: NEGATIVE
Urobilinogen, UA: 1 mg/dL (ref 0.0–1.0)

## 2010-03-21 LAB — TSH: TSH: 3.489 u[IU]/mL (ref 0.350–4.500)

## 2010-04-18 ENCOUNTER — Ambulatory Visit (INDEPENDENT_AMBULATORY_CARE_PROVIDER_SITE_OTHER): Payer: Medicare Other | Admitting: *Deleted

## 2010-04-18 DIAGNOSIS — I4891 Unspecified atrial fibrillation: Secondary | ICD-10-CM

## 2010-04-18 DIAGNOSIS — Z7901 Long term (current) use of anticoagulants: Secondary | ICD-10-CM

## 2010-04-19 LAB — CBC
HCT: 44.7 % (ref 39.0–52.0)
Hemoglobin: 14.8 g/dL (ref 13.0–17.0)
Platelets: 170 10*3/uL (ref 150–400)
WBC: 7.5 10*3/uL (ref 4.0–10.5)

## 2010-04-19 LAB — COMPREHENSIVE METABOLIC PANEL
Albumin: 4.1 g/dL (ref 3.5–5.2)
Alkaline Phosphatase: 54 U/L (ref 39–117)
BUN: 60 mg/dL — ABNORMAL HIGH (ref 6–23)
Chloride: 101 mEq/L (ref 96–112)
Glucose, Bld: 113 mg/dL — ABNORMAL HIGH (ref 70–99)
Potassium: 4.3 mEq/L (ref 3.5–5.1)
Total Bilirubin: 2.4 mg/dL — ABNORMAL HIGH (ref 0.3–1.2)

## 2010-04-19 LAB — URINALYSIS, ROUTINE W REFLEX MICROSCOPIC
Glucose, UA: NEGATIVE mg/dL
Specific Gravity, Urine: 1.013 (ref 1.005–1.030)
pH: 5.5 (ref 5.0–8.0)

## 2010-04-19 LAB — PROTIME-INR
INR: 3.3 — ABNORMAL HIGH (ref 0.00–1.49)
INR: 3.4 — ABNORMAL HIGH (ref 0.00–1.49)

## 2010-04-19 LAB — DIFFERENTIAL
Basophils Absolute: 0 10*3/uL (ref 0.0–0.1)
Basophils Relative: 0 % (ref 0–1)
Eosinophils Absolute: 0.2 10*3/uL (ref 0.0–0.7)
Monocytes Absolute: 0.7 10*3/uL (ref 0.1–1.0)
Neutro Abs: 4.8 10*3/uL (ref 1.7–7.7)

## 2010-05-17 ENCOUNTER — Ambulatory Visit (INDEPENDENT_AMBULATORY_CARE_PROVIDER_SITE_OTHER): Payer: Medicare Other | Admitting: *Deleted

## 2010-05-17 DIAGNOSIS — Z7901 Long term (current) use of anticoagulants: Secondary | ICD-10-CM

## 2010-05-17 DIAGNOSIS — I4891 Unspecified atrial fibrillation: Secondary | ICD-10-CM

## 2010-05-17 LAB — POCT INR: INR: 2.7

## 2010-05-17 NOTE — H&P (Signed)
NAMEADALBERT, ALBERTO                 ACCOUNT NO.:  [id number]   MEDICAL RECORD NO.:  [id number]          PATIENT TYPE:  OBV   LOCATION:  1407                         FACILITY:  [facility]   PHYSICIAN:  Della Goo, M.D. DATE OF BIRTH:  10-22-1924   DATE OF ADMISSION:  02/10/2008  DATE OF DISCHARGE:  02/11/2008                              HISTORY & PHYSICAL   HISTORY OF PRESENTING COMPLAINT:  [facility].   CHIEF COMPLAINT:  Poor appetite and weakness.   HISTORY OF PRESENT ILLNESS:  This is an 77 year old male who presents to  the emergency department with complaints of poor appetite and decreased  intake of food and liquids over the past 3 days.  The patient's wife is  at the bedside and gives the history of the illness.  The patient speaks  very little [place].  However with the assistance of his family, he  denies having any nausea, vomiting or diarrhea.  Denies having any  constipation.  His wife reports that his symptoms actually have been  more than a few weeks and has been worse over the past 3 days.  She  states that he has not had any fevers, chills or congestion symptoms.  He has had weakness and fatigue.  And the patient reports just losing  his appetite.Marland Kitchen  He denies having any chest pain or abdominal pain.   PAST MEDICAL HISTORY:  Significant for:  1. Atrial fibrillation.  2. Hypertension.  3. Coronary artery disease.  4. Type 2 diabetes mellitus.  5. Coagulopathy secondary to Coumadin therapy.   MEDICATIONS:  Clonidine, Coumadin, atenolol, simvastatin, digoxin,  Lasix, nitroglycerin, Pepcid, Imdur, and lorazepam p.r.n.   ALLERGIES:  No known drug allergies.   SOCIAL HISTORY:  The patient is married.  He is a nonsmoker, nondrinker.   FAMILY HISTORY:  Family history is noncontributory.   REVIEW OF SYSTEMS:  Pertinents mentioned above.   PHYSICAL EXAMINATION:  GENERAL:  This is an elderly 77 year old, large  male in discomfort, but no acute distress.  VITAL  SIGNS:  Initially temperature 97.4,  blood pressure 116/65.  Heart  rate 61, respirations 20, oxygen saturation 100% on 2 liters nasal  cannula oxygen.  HEENT:  Normocephalic, atraumatic.  Pupils equally round, reactive to  light.  Extraocular movements are intact.  There is no scleral injection  or conjunctival erythema or injection.  Funduscopic benign.  Nares are  patent bilaterally.  Oropharynx is clear.  Mucosa is dry.  NECK:  Neck is supple, full range of motion.  No thyromegaly,  adenopathy, jugular venous distention.  CARDIOVASCULAR:  Regular rate  and rhythm.  No murmurs, gallops or rubs.  LUNGS:  Clear to auscultation bilaterally.  ABDOMEN:  Positive bowel sounds.  Soft, nontender, nondistended.  There  is no hepatosplenomegaly.  EXTREMITIES:  Without cyanosis, clubbing or  edema.  NEUROLOGIC:  The patient is alert and oriented x3.  He has generalized  weakness but otherwise there are no focal deficits on examination.   LABORATORY STUDIES:  White blood cell count 7.5, hemoglobin 14.8,  hematocrit 44.7, platelets 170,000, MCV 96.5, neutrophils 64%  lymphocytes 24%.  Sodium 143, potassium 4.3, chloride 101, bicarb 28,  BUN 60, creatinine 2.97, and this is changed from previous.  Glucose is  113, lipase 30.  Urinalysis negative.  Protime 36.8, INR 3.4.  Digoxin  level 1.9.  The previous BUN and creatinine were 32 and 1.63 and that  was in December 2009.   ASSESSMENT:  An 77 year old male being admitted with:  1. Dehydration/acute renal failure with chronic renal insufficiency.  2. Anorexia, possible failure to thrive syndrome.  3. Paroxysmal atrial fibrillation.  4. Coagulopathy secondary to Coumadin therapy.   PLAN:  The patient will be admitted for 23-hour observation.  IV fluids  have been ordered for rehydration therapy and KUB will be ordered  initially and if this reveals abnormal results, further imaging studies  will be ordered.  The patient's home medications have  been verified and  continued.  His PT and INR will be monitored daily and the patient will  be placed on GI prophylaxis for now.  Further workup will ensue pending  results of the patient's studies and his clinical course.      Della Goo, M.D.  Electronically Signed     HJ/MEDQ  D:  02/11/2008  T:  02/12/2008  Job:  [id number]

## 2010-05-17 NOTE — Discharge Summary (Signed)
NAMETARAS, RASK NO.:  [id number]   MEDICAL RECORD NO.:  [id number]          PATIENT TYPE:  INP   LOCATION:  4742                         FACILITY:  [facility]   PHYSICIAN:  Peter M. Swaziland, M.D.  DATE OF BIRTH:  Aug 02, 1924   DATE OF ADMISSION:  12/16/2007  DATE OF DISCHARGE:  12/24/2007                               DISCHARGE SUMMARY   HISTORY OF PRESENT ILLNESS:  Blake Arias is an 77 year old white male with  known history of coronary artery disease.  He is status post coronary  artery bypass surgery in 2001.  He has had chronic atrial fibrillation,  he has been on chronic Coumadin therapy.  He has a history of severe  hypertension, hyperlipidemia, and chronic renal insufficiency.  He also  has a history of congestive heart failure due to diastolic dysfunction  with echocardiogram in May 2009 showing normal systolic function with  moderate LVH and severe pulmonary hypertension.  The patient presented  with a 2- to 3-day history of increasing symptoms of shortness of breath  associated with chest discomfort.  His discomfort and shortness of  breath progressed and became more severe and he presented to the  emergency room.  He was found to be in pulmonary edema at this time.  He  was admitted for further evaluation and treatment.  Of note, the patient  is from [place] and he was unable to communicate in [place] and required  interpreter for adequate evaluation.   For details of his past medical history, social history, family history,  and physical exam, please see admission history and physical.   LABORATORY DATA:  His chest x-ray showed cardiomegaly with pulmonary  edema and left greater than right pleural effusions.  Electrocardiogram  showed atrial fibrillation with controlled ventricular response.  He has  LVH by voltage.  There is left axis deviation, nonspecific  interventricular conduction delay.  There were lateral ST-T wave changes  consistent with  ischemia versus dig effect.   ADDITIONAL LABORATORY DATA:  White count was 5500, hemoglobin 11.5,  hematocrit 35.0, platelets 119,000.  Protime was 26.3 with an INR of  2.3, PTT was 40.  Initial point-of-care cardiac markers, CK-MB was 2.1  and troponin was less than 0.05.  Sodium is 143, potassium 4.2, chloride  110, CO2 of 28, glucose of 84, BUN 22, creatinine 1.53.  Dig level was  0.6.  Second set of point-of-care cardiac markers were negative.  Total  cholesterol is 128, triglycerides 62, HDL of 45, LDL of 71.  A1c is 5.5.  BNP level was 1456.  TSH was 3.143.  Dig level was 0.6.   HOSPITAL COURSE:  The patient was admitted to telemetry monitoring.  Since he was therapeutic on his Coumadin, we did not initiate heparin.  He was started on aspirin.  He subsequently ruled out for myocardial  infarction by serial cardiac enzymes and his ECG showed no acute changes  over baseline.  He was treated with IV diuresis with lysis with  excellent response, significant weight loss, and significantly negative  on fluid balance on initial 3 days.  He had an echocardiogram, which  showed mild-to-moderate left ventricular hypertrophy with normal  ejection fraction of 55-60%.  There was mild mitral insufficiency.  There was moderate left atrial enlargement.  He had right  ventricular/right atrial enlargement with evidence of severe pulmonary  hypertension with an estimated right ventricular systolic pressure of 82  mmHg.  The patient continued to feel better.  We reduced his Lasix to a  lower dose of 40 mg b.i.d. from initial high of 80 t.i.d.  He continued  to be significantly hypertensive and we added Norvasc and Cardura to his  medical therapy.  We held his Coumadin in anticipation of proceeding  with cardiac catheterization, but despite holding his Coumadin, his INR  remained elevated.  For several days, he was given a dose of vitamin K  and gradually his protime came down.  At this point although  he had  responded very well clinically, we started to see a rise in his BUN and  creatinine, which peaked with a BUN of 46 and creatinine 3.91.  As we  started seeing his creatinine rise, we discontinued his ACE inhibitor  and held his Lasix.  He was gently hydrated and over the remainder  course of his hospital stay, his BUN and creatinine returned to baseline  with a pre-discharge level of 32 for BUN and creatinine of 1.63.  Given  this acute decline in his renal function during his hospital stay, we  elected not to proceed with cardiac catheterization, it was felt that  this was too risky for him.  His symptoms of angina resolved with  treating his congestive heart failure and he ruled out for myocardial  infarction.  We therefore decided to treat him medically.  Also prior to  his acute decline in renal function, he did have an episode of  hypotension that responded with holding some of his blood pressure  medications including clonidine, lisinopril, and we discontinued the  Cardura.  As noted, the patient was gently hydrated as his renal  function returned to baseline.  He continued to do well from a  congestive heart failure standpoint.  His weight remained stable at 71.2  kg.  He had good urine output.  His oxygen saturations remained normal.  Followup chest x-ray on December 21, 2007, showed resolution of his  pulmonary edema.  He still had a modest left pleural effusion with a  right pleural effusion had resolved.  His BNP remained elevated at 1727.  We did resume his Coumadin and at the time of discharge, his protime was  23 with an INR of 1.9.  We did recheck a dig level during his acute  renal insufficiency and it was 1.1.  His heart rate remained controlled  throughout his hospital stay.  At this point, it was felt the patient  maximized benefit of hospital stay and he was discharged home in stable  condition.  The major changes in his medications were that we had added   antianginal therapy with Norvasc and isosorbide, we had reduced his  clonidine to once a day, and increased his oral Lasix dose to twice, and  he was discharged home on his prior Coumadin dose.   DISCHARGE DIAGNOSES:  1. Acute on chronic congestive heart failure due to acute diastolic      dysfunction.  2. Unstable angina, resolved with treating his congestive heart      failure.  3. Coronary disease status post coronary artery bypass grafting in  2001.  4. Chronic atrial fibrillation.  5. Acute-on-chronic renal insufficiency secondary to diuresis,      hypotension, and angiotensin-converting enzyme inhibitor.  Renal      function is now back to baseline.  6. Hyperlipidemia.  7. Severe hypertension.   DISCHARGE MEDICATIONS:  1. Coumadin 4 mg on Mondays, Wednesdays, Fridays, Saturdays, and      Sundays, 2 mg on Tuesdays and Thursdays.  2. Lanoxin 0.125 mg daily.  3. Atenolol 50 mg daily.  4. Zocor 20 mg daily.  5. Pepcid 20 mg daily.  6. Isosorbide mononitrate 60 mg daily.  7. Norvasc 5 mg daily.  8. Clonidine 0.2 mg only once a day.  9. Lasix 40 mg twice a day.  10.Nitroglycerin 0.4 mg sublingual p.r.n.   DISCHARGE INSTRUCTIONS:  He is instructed to stop his lisinopril.  The  patient was given instruction on low-sodium heart-healthy diet.  He was  given heart failure instructions including daily weight.  He is to  increase his activity.  He will have follow up with Dr. Swaziland in 2  weeks and will repeat BMET, BNP level, and INR at that time.   DISCHARGE STATUS:  Improved.           ______________________________  Peter M. Swaziland, M.D.     PMJ/MEDQ  D:  12/24/2007  T:  12/24/2007  Job:  [id number]   cc:   Melvern Banker

## 2010-05-17 NOTE — H&P (Signed)
NAMECORKY, BLUMSTEIN NO.:  [id number]   MEDICAL RECORD NO.:  [id number]          PATIENT TYPE:  INP   LOCATION:  4742                         FACILITY:  [facility]   PHYSICIAN:  Vernice Jefferson, MD          DATE OF BIRTH:  August 18, 1924   DATE OF ADMISSION:  12/16/2007  DATE OF DISCHARGE:                              HISTORY & PHYSICAL   PRIMARY CARDIOLOGIST:  Peter M. Swaziland, MD   CHIEF COMPLAINT:  Chest discomfort and dyspnea on exertion.   HISTORY OF PRESENT ILLNESS:  The patient is an 77 year old white male  with history of coronary artery bypass grafting in 2001 (status post 5-  vessel with LIMA to OM, saphenous vein graft to PDA x2, saphenous vein  graft to diagonal, and saphenous vein graft to posterolateral artery),  atrial fibrillation on Coumadin therapy, hypertension, hyperlipidemia,  who comes in with complains of increasing chest pain, reports that his  chest pain symptoms usually occur couple of times a week.  However, over  the past 2 or 3 days he has had increasing frequency and severity of the  symptoms.  Reports that he has had chest discomfort with shortness of  breath and diaphoresis that had a crescendo component earlier last night  and earlier this morning.  Reports that currently he is chest pain free  at the present time, has been following Dr. Swaziland since his CABG, but  due to his language barrier (the patient is a [place]), he is not able  to elicit a reliable history in terms of his anginal history.   PAST MEDICAL HISTORY:  1. Coronary artery disease as stated above.  2. Atrial fibrillation, on chronic Coumadin therapy.  3. Hyperlipidemia.  4. Hypertension.   SOCIAL HISTORY:  He lives in [place], [place], very little  [place].  Negative for smoking, alcohol, or drug use.   FAMILY HISTORY:  Reviewed.  Noncontributory of the patient's current  medical conditions.   CURRENT MEDICATIONS:  1. Coumadin 4 mg on Monday, Wednesday, Friday,  Saturday, and Sunday,      and 2 mg on Tuesday and Thursday.  2. Clonidine 0.2 mg p.o. b.i.d.  3. Atenolol 100 mg p.o. b.i.d.  4. Lanoxin 0.125 mg p.o. daily.  5. Lasix 40 mg p.o. daily.  6. Pepcid 20 mg a day.  7. Lisinopril unknown dosing.   ALLERGIES:  No known drug allergies.   REVIEW OF SYSTEMS:  Denies any fever, chills, nausea, vomiting,  diarrhea, or dysuria.  Rest of the 11-point review of systems negative  except for those dictated in the above HPI.   PHYSICAL EXAMINATION:  VITAL SIGNS:  Blood pressure 183/88, heart rate  65, and he is afebrile.  GENERAL:  Well-developed, well-nourished white male in no acute  distress.  HEENT:  Moist mucous membranes.  No scleral icterus or conjunctival  pallor.  NECK:  Supple.  Full range of motion.  Jugular venous pressure,  estimated approximately 8-10 cm of water.  CARDIOVASCULAR:  Irregularly irregular with 3/6 holosystolic murmur at  the apex.  CHEST:  Clear to auscultation bilaterally.  No wheezes, rales, or  rhonchi.  ABDOMEN: Soft, nontender, nondistended.  Normoactive bowel sounds.  EXTREMITIES:  Trace to 1+ pretibial edema.  NEUROLOGIC:  Nonfocal.  SKIN:  No rashes.  No lesions.  MUSCULOSKELETAL:  No joint deformity or effusions.   There is no chest x-ray performed in the ER.  EKG demonstrates atrial  fibrillation, left ventricular hypertrophy with repolarization  abnormality, and QRS widening.   LABORATORY DATA:  Significant for hemoglobin 11.5, platelets of 119, BUN  and creatinine 22 and 1.5.  Biomarkers are negative.  His INR 2.3 and  digoxin level of 0.6.   IMPRESSION:  1. Acute coronary syndrome, unstable angina.  2. Mitral regurgitation by exam.  3. Possible congestive failure, NYHA class II, III symptoms.  4. Atrial fibrillation, on chronic Coumadin therapy.   PLAN:  Admit the patient to telemetry given his INR 2.3 and bleeding  risk, we will not initiate heparin therapy at this time.  We will  initiate  aspirin, statin, continue his beta-blocker, although we will  hold for the morning for possibility of nuclear stress test in the  morning.  We will continue his home medication right now.  I will keep  n.p.o. for risk stratification in the morning.  We also ordered a 2-D  echo cardiogram in the morning and followup on portable chest x-ray.      Vernice Jefferson, MD  Electronically Signed     JT/MEDQ  D:  12/16/2007  T:  12/17/2007  Job:  [id number]

## 2010-05-17 NOTE — Discharge Summary (Signed)
NAMEFARID, GRIGORIAN                 ACCOUNT NO.:  [id number]   MEDICAL RECORD NO.:  [id number]          PATIENT TYPE:  OBV   LOCATION:  1407                         FACILITY:  [facility]   PHYSICIAN:  Richarda Overlie, MD       DATE OF BIRTH:  Apr 09, 1924   DATE OF ADMISSION:  02/10/2008  DATE OF DISCHARGE:  02/11/2008                               DISCHARGE SUMMARY   DISCHARGE DIAGNOSES:  1. Decreased oral intake, likely secondary to constipation/ileus.  2. Acute on chronic renal insufficiency.  3. Diastolic heart failure, stable.  4. Atrial fibrillation, rate controlled.  5. Supratherapeutic increasing international normalized ratio.  6. Coronary artery disease, stable.  7. Hypertension, stable.   SUBJECTIVE:  This is an 77 year old male with history of atrial  fibrillation, coronary artery disease and CABG in 2001, who presented to  the ER with a chief complaint of abdominal pain with poor oral intake  for the last 3 days.  The patient complained of right lower quadrant  pain and constipation as well.  He denied any nausea, vomiting, fever,  chills or rigors at the time of admission.  He was found to be  clinically dehydrated with mild elevation in his BUN and creatinine from  his baseline and was admitted for IV hydration.   HOSPITAL COURSE:  1. Abdominal pain.  The patient's liver function tests, as well as      lipase were found to be within normal limits.  He had an abdominal      series done that showed nonspecific bowel-gas pattern.  The patient      was found have scattered air and stool in the colon and non-      distended air filled loops of small bowel.  Could not rule out mild      ileus or gastroenteritis.  The patient did not have any evidence of      a urinary tract infection.  His pain remained stable during the      course of his stay.  He was admitted for IV hydration and his      abdominal pain was thought to be secondary to constipation and also      perhaps  gastroenteritis contributing to his poor oral intake.  The      patient was advised to hold his Lasix until February 17, 2008.  He      has been started on aggressive constipation protocol with Colace      and MiraLax.  The patient will continue he is discharged.  No      episodes of nausea or vomiting were seen.  The patient has also      been provided with some Zofran as needed for nausea.  2. Supratherapeutic INR.  The patient is on Coumadin for atrial      fibrillation and his INR at the time of admission was 3.4 and then      3.3.  His Coumadin was held during his hospitalization.  His      Coumadin dose is being readjusted to 2 mg on Tuesdays, Thursdays,  Saturdays and Sunday and 4 mg on Monday, Wednesday and Friday.  He      was advised to start his Coumadin on February 12, 2008.  3. History of diastolic heart failure.  The patient has remained      stable and the patient had no evidence of chest pain, shortness of      breath or any exacerbation.  He is being continued on his      cardiotropic medications.  His digoxin level was found to be      therapeutic.   DISPOSITION:  The patient's discharge plan was discussed with the help  of an interpreter, arranged by the case manager.   DISCHARGE INSTRUCTIONS:  1. A 2-gram sodium diet, oral fluids restricted up to 2 liters per      day, lactose-free.  2. Follow up with PCP, Dr. Swaziland in 5-7 days.   DISCHARGE MEDICATIONS:  1. Coumadin 2 mg Tuesday, Thursday, Saturday and Sunday and 4 mg on      Monday, Wednesday and Friday to be started on February 12, 2008.  2. Colace 200 mg p.o. q.12.  3. Zofran 4 mg p.o. q.6 h., p.r.n. for nausea.  4. MiraLax 17 grams p.o. daily p.r.n.  5. Hold Lasix until February 17, 2008.  6. Lanoxin 0.125 mg p.o. daily.  7. Atenolol 50 mg p.o. daily.  8. Zocor 20 mg p.o. daily.  9. Pepcid 20 mg daily.  10.Imdur 60 mg p.o. daily.  11.Norvasc 5 mg daily.  12.Clonidine 0.2 mg p.o. once a day.   13.Nitroglycerin 0.4 mg sublingual p.r.n.    Repeat the following labs; repeat INR on February 13, 2008.   Repeat BMET in 5-7 days.      Richarda Overlie, MD  Electronically Signed     NA/MEDQ  D:  02/11/2008  T:  02/11/2008  Job:  [id number]

## 2010-05-20 NOTE — Discharge Summary (Signed)
[place]  Patient:    Blake Arias, Blake Arias                          MRN: [id number] Adm. Date:  [id number] Disc. Date: 04/17/99 Attending:  Cleatrice Burke Dictator:   Marlowe Kays, P.A. CC:         Alleen Borne, M.D.             Peter M. Swaziland, M.D.                  Referring Physician Discharge [facility]  DATE OF BIRTH:  June 16, 1924.  DISCHARGE DIAGNOSES: 1. Three-vessel coronary artery disease, status post coronary artery bypass    graft. 2. Chronic atrial fibrillation on Coumadin therapy. 3. Hypertension. 4. Pleural epicardial rub, resolved. 5. High creatinine - acute renal insufficiency. 6. Constipation, resolved. 7. Ascending aortic atherosclerosis, per coronary artery bypass graft    observation.  PROCEDURES:  Status post coronary artery bypass graft x 5 with left internal mammary artery to the obtuse marginal, sequential saphenous vein graft to the proximal PD, to the distal PD, saphenous vein graft to the PLA and saphenous vein graft to the diagonal.  COMPLICATIONS:  Acute renal insufficiency secondary to poor fluid intake, improved.  MEDICATIONS: 1. Ultram 50-100 mg one p.o. q.4-6h. p.r.n. pain. 2. Atenolol 50 mg one p.o. q.d. 3. Digoxin 0.____ mg p.o. q.d. 4. Zocor 20 mg p.o. q.d. 5. Coumadin 2.5 mg p.o. q.d. or as directed per Dr. Swaziland. 6. Pepcid 20 mg p.o. b.i.d.  ALLERGIES:  ASPIRIN.  FOLLOW-UP:  Follow up with Dr. Donnie Aho or Dr. Swaziland two weeks after discharge and Dr. Laneta Simmers three weeks after discharge.  The patient will bring chest x-ray to Dr. Garen Grams office after discharge.  HISTORY OF PRESENT ILLNESS:  The patient is a pleasant 77 year old [place] white male who presented with crescendo anginal symptoms to the [facility] office. Cardiac catheterization on March 31, 1999, has shown severe three-vessel coronary artery disease.  There was no aortic insufficiency.  There was some evidence of ascending  arteriosclerosis with some calcification of the proximal root per aortic root angiography.  After review of the angiograms and examination of the patient, it was felt that CABG was the best treatment. Dr. Laneta Simmers discussed the risks and benefits involving the procedure and the patient agreed to continue.  He underwent elective CABG x 5 on April 11, 1999, by Dr. Laneta Simmers without complications.  After the procedure, he was sedated on the ventilation, afebrile, and his blood pressure was stable.  He was A paced at 90 beats per minute.  His urinary output was within normal limits.  His EKG later that afternoon showed sinus bradycardia, otherwise with no other abnormalities seen.  Later that afternoon, he remained stable per Dr. Garen Grams report.  On PO day No. 1, his T-max was 101, however, in the morning, it returned to 99.6.  His blood pressure was stable.  His urinary output was good and his weight was slowly returning to normal.  His CT was removed, as well as the Swan-Ganz catheter.  Pulmonary toilet and diuresis was to be continued. Later that day, he was transferred to the unit 2 [place] and in stable condition.  Because he was having a history of atrial fibrillation on admission, Coumadin was restarted.  Later that afternoon, he started to ambulate very slowly.  On April 13, 1999, PO day No. 2, he increased his  ambulation, although he took several stops to rest.  His saturation of oxygen remained at 93 room air after ambulation.  However, his renal function was slightly worsen than the previous day, with a creatinine of 2.2.  His urine output had decreased, and it was suspected that the patient did not have enough fluid intake.  He was to continue to be observed.  On April 14, 1999, PO day No. 2, his creatinine decreased to 1.9, and his BUN to 34.  However, he returned to his previous chronic atrial fibrillation state, so he was to continue on Coumadin therapy.  Lopressor was changed to  atenolol, for this medication was taken before the patient was admitted.  As well, he was started on digoxin.  On April 15, 1999, PO day No. 4, he was progressing well, he had a therapeutic INR of over 2.3.  He postop renal dysfunction was improving.  He was slightly constipated, so he was given a laxative for resolution of his constipation.  He continued his ambulation without any complications.  On April 16, 1999, his vital signs were stable.  He was afebrile.  He remained in atrial fibrillation.  His saturation of oxygen was within normal limits.  His weight was similar to the one preop.  His INR was therapeutic at 2.7.  His digoxin level was 1.0.  His physical examination was within normal limits. Lungs were clear to auscultation by laterally.  It is expected for the patient to be discharged on April 17, 1999, under stable conditions. DD:  04/16/99 TD:  04/16/99 Job: [id number]

## 2010-05-20 NOTE — Op Note (Signed)
[place]. [facility]  Patient:    Blake Arias, Blake Arias                          MRN: [id number] Proc. Date: 04/11/99 Adm. Date:  [id number] Attending:  Cleatrice Burke CC:         Alleen Borne, M.D.             Peter M. Sw[place]il[place], M.D.             Cath lab                           Operative Report  PREOPERATIVE DIAGNOSIS:  Severe three-vessel coronary artery disease with crescendo angina.  POSTOPERATIVE DIAGNOSIS:  Severe three-vessel coronary artery disease with crescendo angina.  OPERATIVE PROCEDURE:  Median sternotomy, extracorporeal circulation, coronary artery bypass graft surgery x 5 using a left internal mammary artery graft to the obtuse marginal branch of the left circumflex coronary artery, with saphenous vein graft to the diagonal branch of the left anterior descending, a sequential saphenous vein graft to the proximal and distal posterior descending coronary artery, and a saphenous vein graft to the posterolateral branch of the right coronary artery.  SURGEON:  Alleen Borne, M.D.  ASSISTANT:  Lissa Merlin, P.A.  ANESTHESIA:  General endotracheal.  CLINICAL HISTORY:  The patient is a 77 year old [place] gentleman, who presented with crescendo anginal symptoms.  Cardiac catheterization on March 31, 1999 showed severe three-vessel coronary artery disease.  The LAD had a focal area of approximately 30% narrowing proximally.  There was a medium to large first diagonal branch that had about 30% proximal and 95% midvessel stenosis.  The left circumflex had 70 to 80% diffuse proximal stenosis and a single large marginal branch that had 80% proximal stenosis.  The right coronary artery was a large dominant vessel that had 80 to 90% stenosis at the crux.  There was also a 50% distal stenosis.  The posterior descending branch itself had a 95% midvessel stenosis.  There was a large posterolateral branch with a 90% stenosis proximally.  Left ventricular  ejection fraction was 70%. Echocardiogram showed mild mitral valve prolapse without regurgitation.  The aortic valve appeared normal.  The aortic root angiography showed diffuse aortic ectasia and dilatation but no aneurysm formation.  There was no aortic insufficiency.  There was some evidence of ascending atherosclerosis with some calcification of the proximal root.  After review of the angiograms and examination of the patient, it was felt that coronary artery bypass surgery was the best treatment.  I discussed the operative procedure with him through an interpreter, including alternatives to surgery, benefits, and risks including bleeding, possible blood transfusion, infection, stroke, myocardial infarction, and death.  He understood and agreed to proceed.alternatives to  DESCRIPTION OF PROCEDURE:  The patient was taken to the operating room and placed on the table in supine position.  After induction of general endotracheal anesthesia, a Foley catheter was placed in the bladder using sterile technique.  Then the chest, abdomen and both lower extremities were prepped and draped in the usual sterile manner.  The chest was entered through a median sternotomy incision and the pericardium opened in the midline. Examination of the heart showed good ventricular contractility.  The ascending aorta was mildly enlarged.  There was significant aortic atherosclerosis form the midascending aorta outwards.  The proximal root appeared soft.  Then the left internal  mammary artery was harvested from the chest wall as a pedicle graft.  This was a medium caliber vessel with excellent blood flow through it.  At the same time, a segment of greater saphenous vein was harvested from the right leg. This vein was of small to medium caliber and good quality.  Then the patient was heparinized and when an adequate activated clotting time was achieved, the distal ascending aorta was cannulated using a 6.5 mm  aortic cannula for arterial inflow.  Venous outflow was achieved using a two-stage venous cannula through the right atrial appendage.  An antegrade cardioplegia and vent cannula was inserted into the aortic root.  The patient was placed on cardiopulmonary bypass and the distal coronary arteries identified.  The LAD was a large vessel that was intramyocardial in its proximal one half and then exited through the surface of the heart.  There was no visible distal disease in it.  The diagonal branch was diffusely diseased, but graftable.  The obtuse marginal branch was also diffusely diseased with plaque but felt to be graftable.  The right coronary artery gave off a posterior descending branch which was soft proximally but then severely diseased in its midportion.  There was also a large posterolateral branch that had some proximal disease in it.  Then the aorta was cross-clamped and 500 cc of cold blood antegrade cardioplegia was administered in the aortic root with quick arrest of the heart.  Systemic hypothermia to 20 degrees centigrade and topical hypothermia with iced saline was used.  A temperature probe was placed in the septum and an insulating pad in the pericardium.  The first distal anastomosis was performed to the proximal posterior descending coronary artery.  The internal diameter was about 2 mm.  The conduit used was a segment of greater saphenous vein and the anastomosis performed in a sequential side-to-side manner using  manner using continuous 7-0 Prolene suture.  The flow was measured through the graft and was excellent.  The second distal anastomosis was performed to the distal portion of the posterior descending artery.  The internal diameter was about 1.5 mm.  The conduit used was the same segment of greater saphenous vein.  The anastomosis was performed in a sequential end-to-side manner using continuous 7-0 Prolene suture.  The flow was measured through the graft  and was excellent.  Then  another dose of cardioplegia was given down this vein graft.  The third distal anastomosis was performed to the posterolateral branch of the right coronary artery.  The internal diameter was 1.75 mm.  The conduit used was a second segment of greater saphenous vein.  The anastomosis was performed in a end-to-side manner using continuous 7-0 Prolene suture.  The flow was measured through the graft and was excellent.  The fourth distal anastomosis was performed to the diagonal branch of the LAD. The internal diameter was about 1.5 mm.  The conduit used was a third segment of greater saphenous vein.  The anastomosis was performed in a end-to-side manner using continuous 7-0 Prolene suture.  The flow was again measured through the graft and was excellent.  Then another dose of cardioplegia was given down the vein grafts and in the aortic root.  The fifth distal anastomosis was performed to the obtuse marginal artery.  The internal diameter was 1.6 mm.  The conduit used was the left internal mammary artery  and this was brought through an opening in the left pericardium anterior to the phrenic nerve. It was anastomosed  to the LAD in end-to-side manner using continuous 8-0 Prolene suture.  The pedicle was tacked to the epicardium with 6-0 Prolene sutures.  Since the patient had significant ascending aortic atherosclerosis, I decided to perform the proximal anastomoses with the crossclamp on.  These three proximal vein graft anastomoses were performed in an end-to-side manner using continuous 6-0 Prolene suture.  The patient was rewarmed to 37 degrees centigrade.  The head was placed in Trendelenburg position.  The clamp was removed from the mammary pedicle.  There was rapid return of activity to the spontaneous return of sinus rhythm.  The proximal and distal anastomoses appeared hemostatic and the line of the grafts satisfactory.  Graft markers were placed around  the proximal anastomoses.  Two temporary right ventricular and right atrial pacing wires were placed and brought out through the skin.  When the patient had rewarmed to 37 degrees centigrade, he was weaned from cardiopulmonary bypass on low-dose dopamine.  Total bypass time was 110 minutes. Cardiac function appeared excellent with a cardiac output of 4.5L per minute.  Protamine was given and the venous and aortic cannulas were removed without difficulty.  Hemostasis was achieved.  Three chest tubes were placed with a tube in the posterior pericardium and one in the left pleural space and one in the anterior mediastinum.  The pericardium was reapproximated over the heart.  The sternum was closed with #6 stainless steel wires.  The fascia was closed with continuous #1 Vicryl suture.  Subcutaneous tissue was closed using continuous 2-0 Vicryl and the skin with 3-0 Vicryl subcuticular closure.  The lower extremity vein harvest site was closed in layers in a similar manner. The sponge, needle and instrument counts were correct according to the scrub nurse.  Dry sterile dressings were applied over the incisions and around the chest tubes which were hooked to Pleur-Evac suction.  The patient remained hemodynamically stable and was transported to the SICU in guarded but stable condition. DD:  04/11/99 TD:  04/12/99 Job: [id number]

## 2010-05-20 NOTE — Cardiovascular Report (Signed)
[place]. [facility]  Patient:    Blake Arias, Blake Arias                          MRN: [id number] Proc. Date: 03/31/99 Adm. Date:  [id number] Disc. Date: [id number] Attending:  [place], Peter Manning CC:         Alleen Borne, M.D.                        Cardiac Catheterization  INDICATIONS FOR PROCEDURE:  Patient is a 77 year old [place] male who presents ith crescendo angina.  He has history of severe hypercholesterolemia, chronic atrial fibrillation and hypertension.  He has a strong family of coronary disease. Access is via the right femoral artery using the standard Seldinger technique.  EQUIPMENT:  6 French 5 cm left Judkins catheter, 6 French left Amplatz I catheter, 6 French angled pigtail catheter, 6 French arterial sheath.  CONTRAST:  170 cc of Omnipaque.  MEDICATIONS:  Local anesthesia, 1% Xylocaine.  COMMENTARY:  Patient tolerated the procedure well without complications.  HEMODYNAMIC DATA:  Aortic pressure was 155/86, with a mean of 111 mmHg.  Left ventricular pressure was 150 with EDP of 22 mmHg.  ANGIOGRAPHIC DATA:  The left coronary artery arises and distributes normally. he left main coronary artery is normal.  The left anterior descending artery has a 30% narrowing in the proximal vessel.  There was a large first diagonal branch which has severe segmental 90% stenosis  proximally followed by 95% stenosis in the mid vessel. The left circumflex coronary artery has diffuse 70 to 80% stenosis proximally.  There is a large first obtuse marginal vessel which has an 80% stenosis proximally. The left circumflex coronary artery is then occluded after the first obtuse marginal vessels.  There are faint bridging collaterals to the distal circumflex.  The right coronary artery arises anteriorly and distributes normally.  It is a large vessel.  There is an 80 to 90% stenosis at the crux of the right coronary  artery.  There is a 50% stenosis in  the distal right coronary artery.  The posterior descending artery has a 95% stenosis in the mid vessel.  There is a large posterior lateral branch which has a 90% stenosis proximally.  Left ventricular angiography demonstrates normal left ventricular chamber size nd contractility with normal systolic function.  Ejection fraction is estimated at  70%.  There is mild mitral valve prolapse without regurgitation.  The aortic valve appears normal.  Aortic root angiography demonstrates diffuse aortic ectasia and dilatation. There is no aortic insufficiency seen.  FINAL INTERPRETATION: 1. Severe three-vessel obstructive atherosclerotic coronary artery disease. 2. Normal left ventricular function. 3. Aortic ectasia.  PLAN:  Based on these findings, would recommend coronary artery bypass surgery. DD:  03/31/99 TD:  04/01/99 Job: [id number]

## 2010-06-14 ENCOUNTER — Ambulatory Visit (INDEPENDENT_AMBULATORY_CARE_PROVIDER_SITE_OTHER): Payer: Medicare Other | Admitting: *Deleted

## 2010-06-14 DIAGNOSIS — Z7901 Long term (current) use of anticoagulants: Secondary | ICD-10-CM

## 2010-06-14 DIAGNOSIS — I4891 Unspecified atrial fibrillation: Secondary | ICD-10-CM

## 2010-07-12 ENCOUNTER — Ambulatory Visit (INDEPENDENT_AMBULATORY_CARE_PROVIDER_SITE_OTHER): Payer: Medicare Other | Admitting: *Deleted

## 2010-07-12 ENCOUNTER — Other Ambulatory Visit: Payer: Self-pay | Admitting: *Deleted

## 2010-07-12 DIAGNOSIS — I4891 Unspecified atrial fibrillation: Secondary | ICD-10-CM

## 2010-07-12 DIAGNOSIS — Z7901 Long term (current) use of anticoagulants: Secondary | ICD-10-CM

## 2010-07-12 LAB — POCT INR: INR: 3.1

## 2010-07-12 MED ORDER — LORAZEPAM 0.5 MG PO TABS: 0.5000 mg | ORAL_TABLET | Freq: Three times a day (TID) | ORAL | Status: AC

## 2010-07-12 MED ORDER — WARFARIN SODIUM 4 MG PO TABS: ORAL_TABLET | ORAL | Status: DC

## 2010-07-12 MED ORDER — WARFARIN SODIUM 2 MG PO TABS: ORAL_TABLET | ORAL | Status: DC

## 2010-07-12 NOTE — Telephone Encounter (Signed)
Was in for INR check and wanted refills on medications. Sent to [facility]

## 2010-07-25 ENCOUNTER — Ambulatory Visit (INDEPENDENT_AMBULATORY_CARE_PROVIDER_SITE_OTHER): Payer: Medicare Other | Admitting: *Deleted

## 2010-07-25 DIAGNOSIS — Z7901 Long term (current) use of anticoagulants: Secondary | ICD-10-CM

## 2010-07-25 DIAGNOSIS — I4891 Unspecified atrial fibrillation: Secondary | ICD-10-CM

## 2010-07-25 LAB — POCT INR: INR: 2.7

## 2010-08-04 ENCOUNTER — Other Ambulatory Visit: Payer: Self-pay | Admitting: Cardiology

## 2010-08-04 ENCOUNTER — Other Ambulatory Visit: Payer: Self-pay | Admitting: *Deleted

## 2010-08-04 MED ORDER — LORAZEPAM 0.5 MG PO TABS: 0.5000 mg | ORAL_TABLET | Freq: Three times a day (TID) | ORAL | Status: AC | PRN

## 2010-08-04 NOTE — Telephone Encounter (Signed)
escribe medication per fax request  

## 2010-08-22 ENCOUNTER — Ambulatory Visit (INDEPENDENT_AMBULATORY_CARE_PROVIDER_SITE_OTHER): Payer: Medicare Other | Admitting: *Deleted

## 2010-08-22 DIAGNOSIS — I4891 Unspecified atrial fibrillation: Secondary | ICD-10-CM

## 2010-08-22 DIAGNOSIS — Z7901 Long term (current) use of anticoagulants: Secondary | ICD-10-CM

## 2010-09-06 ENCOUNTER — Other Ambulatory Visit: Payer: Self-pay | Admitting: Cardiology

## 2010-09-06 NOTE — Telephone Encounter (Signed)
escribe medication per fax request  

## 2010-09-19 ENCOUNTER — Ambulatory Visit (INDEPENDENT_AMBULATORY_CARE_PROVIDER_SITE_OTHER): Payer: Medicare Other | Admitting: *Deleted

## 2010-09-19 DIAGNOSIS — Z7901 Long term (current) use of anticoagulants: Secondary | ICD-10-CM

## 2010-09-19 DIAGNOSIS — I4891 Unspecified atrial fibrillation: Secondary | ICD-10-CM

## 2010-09-20 ENCOUNTER — Other Ambulatory Visit: Payer: Self-pay | Admitting: *Deleted

## 2010-09-20 MED ORDER — FUROSEMIDE 40 MG PO TABS: 40.0000 mg | ORAL_TABLET | Freq: Every day | ORAL | Status: DC

## 2010-09-20 MED ORDER — ATENOLOL 50 MG PO TABS: 50.0000 mg | ORAL_TABLET | Freq: Every day | ORAL | Status: DC

## 2010-09-20 NOTE — Telephone Encounter (Signed)
REFILLED ATENOLOL AND LASIX ON 09/20/10. OMEPRAZOLE WAS ALREADY REFILLED ON 09/06/2010

## 2010-09-24 ENCOUNTER — Encounter: Payer: Self-pay | Admitting: Cardiology

## 2010-10-07 LAB — LIPID PANEL
Cholesterol: 128 mg/dL (ref 0–200)
HDL: 45 mg/dL (ref >39)
LDL Cholesterol: 71 mg/dL (ref 0–99)
Total CHOL/HDL Ratio: 2.8 RATIO

## 2010-10-07 LAB — DIFFERENTIAL
Basophils Absolute: 0 10*3/uL (ref 0.0–0.1)
Basophils Absolute: 0 K/uL (ref 0.0–0.1)
Basophils Relative: 0 % (ref 0–1)
Basophils Relative: 1 % (ref 0–1)
Eosinophils Absolute: 0.3 K/uL (ref 0.0–0.7)
Eosinophils Relative: 5 % (ref 0–5)
Lymphocytes Relative: 14 % (ref 12–46)
Lymphocytes Relative: 16 % (ref 12–46)
Lymphs Abs: 0.7 10*3/uL (ref 0.7–4.0)
Monocytes Absolute: 0.4 K/uL (ref 0.1–1.0)
Monocytes Relative: 8 % (ref 3–12)
Neutro Abs: 4 10*3/uL (ref 1.7–7.7)
Neutro Abs: 4.8 10*3/uL (ref 1.7–7.7)
Neutrophils Relative %: 72 % (ref 43–77)
Neutrophils Relative %: 73 % (ref 43–77)

## 2010-10-07 LAB — COMPREHENSIVE METABOLIC PANEL WITH GFR
ALT: 19 U/L (ref 0–53)
Alkaline Phosphatase: 70 U/L (ref 39–117)
BUN: 22 mg/dL (ref 6–23)
Chloride: 110 meq/L (ref 96–112)
Glucose, Bld: 84 mg/dL (ref 70–99)
Potassium: 4.2 meq/L (ref 3.5–5.1)
Sodium: 143 meq/L (ref 135–145)
Total Bilirubin: 1.6 mg/dL — ABNORMAL HIGH (ref 0.3–1.2)
Total Protein: 7.1 g/dL (ref 6.0–8.3)

## 2010-10-07 LAB — BASIC METABOLIC PANEL
BUN: 35 mg/dL — ABNORMAL HIGH (ref 6–23)
BUN: 41 mg/dL — ABNORMAL HIGH (ref 6–23)
BUN: 46 mg/dL — ABNORMAL HIGH (ref 6–23)
BUN: 47 mg/dL — ABNORMAL HIGH (ref 6–23)
CO2: 29 mEq/L (ref 19–32)
CO2: 29 mEq/L (ref 19–32)
CO2: 31 mEq/L (ref 19–32)
CO2: 31 mEq/L (ref 19–32)
CO2: 32 mEq/L (ref 19–32)
Calcium: 8.4 mg/dL (ref 8.4–10.5)
Calcium: 8.8 mg/dL (ref 8.4–10.5)
Calcium: 8.9 mg/dL (ref 8.4–10.5)
Calcium: 8.9 mg/dL (ref 8.4–10.5)
Calcium: 9.1 mg/dL (ref 8.4–10.5)
Calcium: 9.2 mg/dL (ref 8.4–10.5)
Chloride: 104 mEq/L (ref 96–112)
Chloride: 108 mEq/L (ref 96–112)
Chloride: 99 mEq/L (ref 96–112)
Creatinine, Ser: 1.63 mg/dL — ABNORMAL HIGH (ref 0.4–1.5)
Creatinine, Ser: 2.94 mg/dL — ABNORMAL HIGH (ref 0.4–1.5)
Creatinine, Ser: 3.91 mg/dL — ABNORMAL HIGH (ref 0.4–1.5)
GFR calc Af Amer: 18 mL/min — ABNORMAL LOW (ref >60)
GFR calc Af Amer: 42 mL/min — ABNORMAL LOW (ref >60)
GFR calc Af Amer: 47 mL/min — ABNORMAL LOW (ref >60)
GFR calc Af Amer: 49 mL/min — ABNORMAL LOW (ref >60)
GFR calc non Af Amer: 21 mL/min — ABNORMAL LOW (ref >60)
GFR calc non Af Amer: 26 mL/min — ABNORMAL LOW (ref >60)
GFR calc non Af Amer: 39 mL/min — ABNORMAL LOW (ref >60)
Glucose, Bld: 92 mg/dL (ref 70–99)
Glucose, Bld: 95 mg/dL (ref 70–99)
Glucose, Bld: 99 mg/dL (ref 70–99)
Potassium: 3.6 mEq/L (ref 3.5–5.1)
Potassium: 3.7 mEq/L (ref 3.5–5.1)
Potassium: 4 mEq/L (ref 3.5–5.1)
Sodium: 140 mEq/L (ref 135–145)
Sodium: 141 mEq/L (ref 135–145)
Sodium: 144 mEq/L (ref 135–145)
Sodium: 144 mEq/L (ref 135–145)
Sodium: 144 mEq/L (ref 135–145)
Sodium: 144 mEq/L (ref 135–145)

## 2010-10-07 LAB — PROTIME-INR
INR: 1.6 — ABNORMAL HIGH (ref 0.00–1.49)
INR: 1.9 — ABNORMAL HIGH (ref 0.00–1.49)
INR: 1.9 — ABNORMAL HIGH (ref 0.00–1.49)
INR: 2.3 — ABNORMAL HIGH (ref 0.00–1.49)
INR: 2.3 — ABNORMAL HIGH (ref 0.00–1.49)
INR: 2.5 — ABNORMAL HIGH (ref 0.00–1.49)
Prothrombin Time: 19.3 seconds — ABNORMAL HIGH (ref 11.6–15.2)
Prothrombin Time: 23 seconds — ABNORMAL HIGH (ref 11.6–15.2)
Prothrombin Time: 26.3 s — ABNORMAL HIGH (ref 11.6–15.2)
Prothrombin Time: 26.5 seconds — ABNORMAL HIGH (ref 11.6–15.2)
Prothrombin Time: 29.7 seconds — ABNORMAL HIGH (ref 11.6–15.2)

## 2010-10-07 LAB — COMPREHENSIVE METABOLIC PANEL
AST: 27 U/L (ref 0–37)
Albumin: 3.6 g/dL (ref 3.5–5.2)
CO2: 28 mEq/L (ref 19–32)
Calcium: 9.2 mg/dL (ref 8.4–10.5)
Creatinine, Ser: 1.53 mg/dL — ABNORMAL HIGH (ref 0.4–1.5)
GFR calc Af Amer: 53 mL/min — ABNORMAL LOW (ref >60)
GFR calc non Af Amer: 44 mL/min — ABNORMAL LOW (ref >60)

## 2010-10-07 LAB — HEMOGLOBIN A1C: Hgb A1c MFr Bld: 5.5 % (ref 4.6–6.1)

## 2010-10-07 LAB — CBC
HCT: 35 % — ABNORMAL LOW (ref 39.0–52.0)
Hemoglobin: 11.4 g/dL — ABNORMAL LOW (ref 13.0–17.0)
Hemoglobin: 11.5 g/dL — ABNORMAL LOW (ref 13.0–17.0)
MCHC: 33 g/dL (ref 30.0–36.0)
MCHC: 33 g/dL (ref 30.0–36.0)
MCHC: 33.7 g/dL (ref 30.0–36.0)
MCV: 95.3 fL (ref 78.0–100.0)
Platelets: 119 10*3/uL — ABNORMAL LOW (ref 150–400)
Platelets: 123 10*3/uL — ABNORMAL LOW (ref 150–400)
Platelets: 161 10*3/uL (ref 150–400)
RBC: 3.67 MIL/uL — ABNORMAL LOW (ref 4.22–5.81)
RBC: 3.9 MIL/uL — ABNORMAL LOW (ref 4.22–5.81)
RDW: 16 % — ABNORMAL HIGH (ref 11.5–15.5)
RDW: 16.2 % — ABNORMAL HIGH (ref 11.5–15.5)
RDW: 16.4 % — ABNORMAL HIGH (ref 11.5–15.5)
WBC: 5.5 K/uL (ref 4.0–10.5)

## 2010-10-07 LAB — POCT CARDIAC MARKERS
CKMB, poc: 2 ng/mL (ref 1.0–8.0)
CKMB, poc: 2.1 ng/mL (ref 1.0–8.0)
Myoglobin, poc: 108 ng/mL (ref 12–200)
Myoglobin, poc: 86 ng/mL (ref 12–200)
Troponin i, poc: <0.05 ng/mL (ref 0.00–0.09)
Troponin i, poc: <0.05 ng/mL (ref 0.00–0.09)

## 2010-10-07 LAB — CARDIAC PANEL(CRET KIN+CKTOT+MB+TROPI)
CK, MB: 2.6 ng/mL (ref 0.3–4.0)
Relative Index: INVALID (ref 0.0–2.5)
Total CK: 66 U/L (ref 7–232)
Troponin I: 0.03 ng/mL (ref 0.00–0.06)

## 2010-10-07 LAB — B-NATRIURETIC PEPTIDE (CONVERTED LAB)
Pro B Natriuretic peptide (BNP): 1727 pg/mL — ABNORMAL HIGH (ref 0.0–100.0)
Pro B Natriuretic peptide (BNP): 1733 pg/mL — ABNORMAL HIGH (ref 0.0–100.0)
Pro B Natriuretic peptide (BNP): 668 pg/mL — ABNORMAL HIGH (ref 0.0–100.0)

## 2010-10-07 LAB — DIGOXIN LEVEL: Digoxin Level: 0.6 ng/mL — ABNORMAL LOW (ref 0.8–2.0)

## 2010-10-07 LAB — APTT: aPTT: 40 seconds — ABNORMAL HIGH (ref 24–37)

## 2010-10-10 ENCOUNTER — Ambulatory Visit (INDEPENDENT_AMBULATORY_CARE_PROVIDER_SITE_OTHER): Payer: Medicare Other | Admitting: *Deleted

## 2010-10-10 ENCOUNTER — Encounter: Payer: Self-pay | Admitting: Cardiology

## 2010-10-10 ENCOUNTER — Ambulatory Visit (INDEPENDENT_AMBULATORY_CARE_PROVIDER_SITE_OTHER): Payer: Medicare Other | Admitting: Cardiology

## 2010-10-10 VITALS — BP 104/66 | HR 74 | Wt 150.0 lb

## 2010-10-10 DIAGNOSIS — Z7901 Long term (current) use of anticoagulants: Secondary | ICD-10-CM

## 2010-10-10 DIAGNOSIS — IMO0002 Reserved for concepts with insufficient information to code with codable children: Secondary | ICD-10-CM

## 2010-10-10 DIAGNOSIS — E785 Hyperlipidemia, unspecified: Secondary | ICD-10-CM | POA: Insufficient documentation

## 2010-10-10 DIAGNOSIS — I4891 Unspecified atrial fibrillation: Secondary | ICD-10-CM

## 2010-10-10 DIAGNOSIS — I509 Heart failure, unspecified: Secondary | ICD-10-CM

## 2010-10-10 DIAGNOSIS — I2510 Atherosclerotic heart disease of native coronary artery without angina pectoris: Secondary | ICD-10-CM

## 2010-10-10 DIAGNOSIS — I5032 Chronic diastolic (congestive) heart failure: Secondary | ICD-10-CM | POA: Insufficient documentation

## 2010-10-10 DIAGNOSIS — N189 Chronic kidney disease, unspecified: Secondary | ICD-10-CM

## 2010-10-10 DIAGNOSIS — I272 Pulmonary hypertension, unspecified: Secondary | ICD-10-CM

## 2010-10-10 DIAGNOSIS — I482 Chronic atrial fibrillation, unspecified: Secondary | ICD-10-CM

## 2010-10-10 MED ORDER — LORAZEPAM 0.5 MG PO TABS: 0.5000 mg | ORAL_TABLET | Freq: Three times a day (TID) | ORAL | Status: DC | PRN

## 2010-10-10 NOTE — Assessment & Plan Note (Signed)
Sounds like he hasn't had regular followup with his primary physician. We will plan on checking fasting lab work on his next visit in 6 months including chemistries, CBC, and lipid panel.

## 2010-10-10 NOTE — Patient Instructions (Signed)
Continue your current medications.   We will check your coumadin again in 4 weeks.  I will see you again in 6 months and we will check fasting lab work at that time.

## 2010-10-10 NOTE — Assessment & Plan Note (Signed)
He is having no significant anginal symptoms today. He is status post CABG in 2001. His last stress test was in 2006 and showed only mild inferior basal attenuation. At his age we will forego screening evaluation unless he develops symptoms of angina.

## 2010-10-10 NOTE — Assessment & Plan Note (Signed)
Rate is well controlled and he is anticoagulated. We'll recheck an INR in 4 weeks.

## 2010-10-10 NOTE — Progress Notes (Signed)
Blake Arias Date of Birth: 03/14/1924   History of Present Illness: Blake Arias is seen today for followup. He has a history of congestive heart failure secondary to diastolic dysfunction. He has a history of coronary disease and is status post CABG in 2001. This included an LIMA graft to the obtuse marginal vessel, saphenous vein graft to the diagonal, saphenous vein graft to the proximal and distal PDA, and saphenous vein graft to the posterior lateral branch of the right coronary. He has a history of chronic atrial fibrillation. On followup today he states he is feeling very well. He denies any dizziness, palpitations, or syncope. He's had no chest pain. His breathing is doing very well. He has no orthopnea or PND or increased edema.  Current Outpatient Prescriptions on File Prior to Visit  Medication Sig Dispense Refill  . atenolol (TENORMIN) 50 MG tablet Take 1 tablet (50 mg total) by mouth daily.  30 tablet  5  . furosemide (LASIX) 40 MG tablet Take 1 tablet (40 mg total) by mouth daily.  30 tablet  5  . Multiple Vitamins-Minerals (CENTRUM SILVER PO) Take by mouth.        . nitroGLYCERIN (NITROSTAT) 0.4 MG SL tablet Place 0.4 mg under the tongue every 5 (five) minutes as needed.        Marland Kitchen omeprazole (PRILOSEC) 20 MG capsule TAKE 1 CAPSULE EACH MORNING  30 capsule  5  . polyethylene glycol powder (GLYCOLAX/MIRALAX) powder TAKE 1 CAPFUL IN 4 TO 8 OZ. OF WATER OR JUICE DAILY  119 g  5  . warfarin (COUMADIN) 2 MG tablet Take 2 mg on Sunday and Wednesday or as directed  10 tablet  5  . warfarin (COUMADIN) 4 MG tablet Take 4 mg every day except Sunday and Wednesday  30 tablet  5    No Known Allergies  Past Medical History  Diagnosis Date  . CHF (congestive heart failure)   . History of diastolic dysfunction   . Coronary artery disease   . Chronic renal insufficiency   . Hypertension   . Pulmonary hypertension   . Hyperlipidemia   . Chronic atrial fibrillation   . SBO (small bowel  obstruction)     PARTIAL    Past Surgical History  Procedure Date  . Cardiac catheterization 03/31/1999    EF 70%  . Cholecystectomy   . External ear surgery   . Coronary artery bypass graft 2001    LIMA GRAFT TO THE OBTUSE MARGINAL VESSEL, SAPHENOUS VEIN GRAFT TO THE DIAGONAL , SAPHENOUS VEIN GRAFT TO THE PROXIMAL AND DISTAL PDA, AND SAPHENOUS VEIN GRAFT TO THE POSTERIOR LATERAL BRANCH THE RIGHT CORONARY  . Transthoracic echocardiogram 12/17/2007    EF 55-60%  . US echocardiography 05/31/2007    EF 55-60%  . US echocardiography 09/21/2004    EF 55-60%  . Cardiovascular stress test 09/22/2004    EF 53%    History  Smoking status  . Never Smoker   Smokeless tobacco  . Not on file    History  Alcohol Use No    Family History  Problem Relation Age of Onset  . Heart attack Mother   . Heart attack Brother   . Hypertension Brother   . Hypertension Sister     Review of Systems: The review of systems is positive for using lorazepam 3 times daily for anxiety. He is no longer seeing his primary care at [facility]. All other systems were reviewed and are negative.  Physical Exam: BP  104/66  Pulse 74  Wt 150 lb (68.04 kg) He is an elderly gentleman in no acute distress. He is normocephalic, atraumatic. Pupils equal round and react to light accommodation. Extraocular movements are full. Oropharynx is clear. Neck is supple no JVD, adenopathy, or megaly, or bruits. Lungs were clear. Cardiac exam reveals an irregular rate and rhythm with a grade 2/6 systolic murmur in the apex. Abdomen is soft and nontender. He has no masses or bruits. Extremities are without edema. Pedal pulses are good. Skin is warm and dry. He is alert and oriented x3. Cranial nerves are grossly intact. LABORATORY DATA: INR today is 2.6.  Assessment / Plan:

## 2010-10-10 NOTE — Assessment & Plan Note (Signed)
He is well compensated on his current therapy. We'll continue his current dose of Lasix and atenolol. I have reinforced the need for sodium restriction in his diet.

## 2010-11-07 ENCOUNTER — Ambulatory Visit (INDEPENDENT_AMBULATORY_CARE_PROVIDER_SITE_OTHER): Payer: Medicare Other | Admitting: *Deleted

## 2010-11-07 DIAGNOSIS — Z7901 Long term (current) use of anticoagulants: Secondary | ICD-10-CM

## 2010-11-07 DIAGNOSIS — I4891 Unspecified atrial fibrillation: Secondary | ICD-10-CM

## 2010-11-07 LAB — POCT INR: INR: 2.6

## 2010-12-19 ENCOUNTER — Ambulatory Visit (INDEPENDENT_AMBULATORY_CARE_PROVIDER_SITE_OTHER): Payer: Medicare Other | Admitting: *Deleted

## 2010-12-19 DIAGNOSIS — I4891 Unspecified atrial fibrillation: Secondary | ICD-10-CM

## 2011-01-30 ENCOUNTER — Ambulatory Visit (INDEPENDENT_AMBULATORY_CARE_PROVIDER_SITE_OTHER): Payer: Medicare Other | Admitting: *Deleted

## 2011-01-30 DIAGNOSIS — I4891 Unspecified atrial fibrillation: Secondary | ICD-10-CM

## 2011-01-30 DIAGNOSIS — Z7901 Long term (current) use of anticoagulants: Secondary | ICD-10-CM

## 2011-01-30 MED ORDER — POLYETHYLENE GLYCOL 3350 17 GM/SCOOP PO POWD: ORAL | Status: DC

## 2011-03-01 ENCOUNTER — Telehealth: Payer: Self-pay

## 2011-03-01 ENCOUNTER — Other Ambulatory Visit: Payer: Self-pay | Admitting: Cardiology

## 2011-03-01 MED ORDER — FUROSEMIDE 40 MG PO TABS: 40.0000 mg | ORAL_TABLET | Freq: Every day | ORAL | Status: DC

## 2011-03-01 MED ORDER — WARFARIN SODIUM 2 MG PO TABS: ORAL_TABLET | ORAL | Status: DC

## 2011-03-01 MED ORDER — LORAZEPAM 0.5 MG PO TABS: 0.5000 mg | ORAL_TABLET | Freq: Three times a day (TID) | ORAL | Status: DC | PRN

## 2011-03-01 NOTE — Telephone Encounter (Signed)
[facility] called Lorazepam 0.5 mg 1 every 8 hrs if needed for anxiety.#90 refills x 3.

## 2011-03-13 ENCOUNTER — Ambulatory Visit (INDEPENDENT_AMBULATORY_CARE_PROVIDER_SITE_OTHER): Payer: Medicare Other

## 2011-03-13 DIAGNOSIS — I4891 Unspecified atrial fibrillation: Secondary | ICD-10-CM

## 2011-03-13 DIAGNOSIS — Z7901 Long term (current) use of anticoagulants: Secondary | ICD-10-CM

## 2011-04-04 ENCOUNTER — Other Ambulatory Visit: Payer: Self-pay | Admitting: *Deleted

## 2011-04-04 MED ORDER — NITROGLYCERIN 0.4 MG SL SUBL: 0.4000 mg | SUBLINGUAL_TABLET | SUBLINGUAL | Status: DC | PRN

## 2011-04-06 ENCOUNTER — Other Ambulatory Visit: Payer: Self-pay

## 2011-04-06 MED ORDER — POLYETHYLENE GLYCOL 3350 17 GM/SCOOP PO POWD: ORAL | Status: DC

## 2011-04-06 NOTE — Telephone Encounter (Signed)
.   Requested Prescriptions   Signed Prescriptions Disp Refills  . polyethylene glycol powder (GLYCOLAX/MIRALAX) powder 119 g 5    Sig: Take 1 Capful in 4 to 8oz of water or juice daily    Authorizing Provider: [place], PETER M    Ordering User: Lacie Scotts   E-filed to [facility].

## 2011-04-25 ENCOUNTER — Telehealth: Payer: Self-pay | Admitting: Pharmacist

## 2011-04-25 ENCOUNTER — Ambulatory Visit (INDEPENDENT_AMBULATORY_CARE_PROVIDER_SITE_OTHER): Payer: Medicare Other | Admitting: Pharmacist

## 2011-04-25 ENCOUNTER — Telehealth: Payer: Self-pay | Admitting: Cardiology

## 2011-04-25 ENCOUNTER — Ambulatory Visit (INDEPENDENT_AMBULATORY_CARE_PROVIDER_SITE_OTHER): Payer: Medicare Other | Admitting: Cardiology

## 2011-04-25 ENCOUNTER — Encounter: Payer: Self-pay | Admitting: Cardiology

## 2011-04-25 VITALS — BP 110/58 | HR 68 | Ht 68.0 in | Wt 153.6 lb

## 2011-04-25 DIAGNOSIS — I4891 Unspecified atrial fibrillation: Secondary | ICD-10-CM

## 2011-04-25 DIAGNOSIS — I509 Heart failure, unspecified: Secondary | ICD-10-CM

## 2011-04-25 DIAGNOSIS — Z7901 Long term (current) use of anticoagulants: Secondary | ICD-10-CM

## 2011-04-25 DIAGNOSIS — I2510 Atherosclerotic heart disease of native coronary artery without angina pectoris: Secondary | ICD-10-CM

## 2011-04-25 DIAGNOSIS — I482 Chronic atrial fibrillation, unspecified: Secondary | ICD-10-CM

## 2011-04-25 LAB — BASIC METABOLIC PANEL
BUN: 68 mg/dL — ABNORMAL HIGH (ref 6–23)
Chloride: 103 mEq/L (ref 96–112)
Creatinine, Ser: 3 mg/dL — ABNORMAL HIGH (ref 0.4–1.5)
Glucose, Bld: 90 mg/dL (ref 70–99)
Potassium: 4.4 mEq/L (ref 3.5–5.1)

## 2011-04-25 LAB — HEPATIC FUNCTION PANEL
ALT: 11 U/L (ref 0–53)
AST: 18 U/L (ref 0–37)
Albumin: 3.5 g/dL (ref 3.5–5.2)
Total Bilirubin: 0.6 mg/dL (ref 0.3–1.2)

## 2011-04-25 LAB — CBC WITH DIFFERENTIAL/PLATELET
Basophils Absolute: 0 10*3/uL (ref 0.0–0.1)
Eosinophils Absolute: 0.2 10*3/uL (ref 0.0–0.7)
Lymphocytes Relative: 15.8 % (ref 12.0–46.0)
MCHC: 30.5 g/dL (ref 30.0–36.0)
Neutrophils Relative %: 70.6 % (ref 43.0–77.0)
RDW: 19.4 % — ABNORMAL HIGH (ref 11.5–14.6)

## 2011-04-25 LAB — LIPID PANEL
Cholesterol: 163 mg/dL (ref 0–200)
LDL Cholesterol: 97 mg/dL (ref 0–99)
Triglycerides: 88 mg/dL (ref 0.0–149.0)
VLDL: 17.6 mg/dL (ref 0.0–40.0)

## 2011-04-25 MED ORDER — ATENOLOL 50 MG PO TABS: 50.0000 mg | ORAL_TABLET | Freq: Every day | ORAL | Status: DC

## 2011-04-25 NOTE — Assessment & Plan Note (Signed)
His rate is well controlled. We will continue with atenolol. He is on chronic anticoagulation with Coumadin.

## 2011-04-25 NOTE — Telephone Encounter (Signed)
Message copied by Velda Shell on Tue Apr 25, 2011  4:10 PM ------      Message from: Meda Klinefelter D      Created: Tue Apr 25, 2011 10:23 AM       Patient needs refill on warfarin sent to [facility] [address].                              Thanks

## 2011-04-25 NOTE — Assessment & Plan Note (Signed)
He is well compensated on exam today. Continue with his current dose of Lasix 40 mg once a day. Continue with atenolol. We will check fasting lab work today including chemistries.

## 2011-04-25 NOTE — Telephone Encounter (Signed)
Error

## 2011-04-25 NOTE — Patient Instructions (Signed)
Continue your current therapy  We will call with the results of your lab work today.  I will see you again in 6 months.   

## 2011-04-25 NOTE — Telephone Encounter (Signed)
Please return call to [facility] [phone]   [facility] has questions about patient instructions.  Patient says he is suppose to see Dr. Reche Dixon per Dr. [place] please return call to [facility] to clarify referring instructions.

## 2011-04-25 NOTE — Telephone Encounter (Signed)
Spoke to [facility] they stated patient has not been seen there in over 2 years.They referred patient to urgent care at [facility] to evaluate anemia and then patient will follow back up with them.Stated they had access to epic and did not have to fax labs.

## 2011-04-25 NOTE — Progress Notes (Signed)
Blake Arias Date of Birth: 1924/01/30   History of Present Illness: [name] is seen today for followup. He has a history of congestive heart failure secondary to diastolic dysfunction. He has a history of coronary disease and is status post CABG in 2001. This included an LIMA graft to the obtuse marginal vessel, saphenous vein graft to the diagonal, saphenous vein graft to the proximal and distal PDA, and saphenous vein graft to the posterior lateral branch of the right coronary. He has a history of chronic atrial fibrillation. Today he states he is feeling very well. He has had no shortness of breath, chest pain, or dizziness. He has had no increase in edema. His energy level is good.  Current Outpatient Prescriptions on File Prior to Visit  Medication Sig Dispense Refill  . furosemide (LASIX) 40 MG tablet Take 1 tablet (40 mg total) by mouth daily.  30 tablet  11  . LORazepam (ATIVAN) 0.5 MG tablet Take 1 tablet (0.5 mg total) by mouth every 8 (eight) hours as needed for anxiety.  90 tablet  3  . Multiple Vitamins-Minerals (CENTRUM SILVER PO) Take by mouth.        . nitroGLYCERIN (NITROSTAT) 0.4 MG SL tablet Place 1 tablet (0.4 mg total) under the tongue every 5 (five) minutes as needed.  25 tablet  11  . omeprazole (PRILOSEC) 20 MG capsule TAKE 1 CAPSULE EACH MORNING  30 capsule  5  . polyethylene glycol powder (GLYCOLAX/MIRALAX) powder Take 1 Capful in 4 to 8oz of water or juice daily  119 g  5  . warfarin (COUMADIN) 2 MG tablet Take as directed by the Anticoagulation Clinic  50 tablet  1  . DISCONTD: atenolol (TENORMIN) 50 MG tablet Take 1 tablet (50 mg total) by mouth daily.  30 tablet  5    No Known Allergies  Past Medical History  Diagnosis Date  . CHF (congestive heart failure)   . History of diastolic dysfunction   . Coronary artery disease   . Chronic renal insufficiency   . Hypertension   . Pulmonary hypertension   . Hyperlipidemia   . Chronic atrial fibrillation   .  SBO (small bowel obstruction)     PARTIAL    Past Surgical History  Procedure Date  . Cardiac catheterization 03/31/1999    EF 70%  . Cholecystectomy   . External ear surgery   . Coronary artery bypass graft 2001    LIMA GRAFT TO THE OBTUSE MARGINAL VESSEL, SAPHENOUS VEIN GRAFT TO THE DIAGONAL , SAPHENOUS VEIN GRAFT TO THE PROXIMAL AND DISTAL PDA, AND SAPHENOUS VEIN GRAFT TO THE POSTERIOR LATERAL BRANCH THE RIGHT CORONARY  . Transthoracic echocardiogram 12/17/2007    EF 55-60%  . US echocardiography 05/31/2007    EF 55-60%  . US echocardiography 09/21/2004    EF 55-60%  . Cardiovascular stress test 09/22/2004    EF 53%    History  Smoking status  . Never Smoker   Smokeless tobacco  . Not on file    History  Alcohol Use No    Family History  Problem Relation Age of Onset  . Heart attack Mother   . Heart attack Brother   . Hypertension Brother   . Hypertension Sister     Review of Systems: As noted in history of present illness. All other systems were reviewed and are negative.  Physical Exam: BP 110/58  Pulse 68  Ht 5\' 8"  (1.727 m)  Wt 69.673 kg (153 lb 9.6  oz)  BMI 23.35 kg/m2 He is an elderly gentleman in no acute distress. He is normocephalic, atraumatic. Pupils equal round and react to light accommodation. Extraocular movements are full. Oropharynx is clear. Neck is supple no JVD, adenopathy, or thyromegaly, or bruits. Lungs were clear. Cardiac exam reveals an irregular rate and rhythm with a grade 2/6 systolic murmur in the apex. Abdomen is soft and nontender. He has no masses or bruits. Extremities are without edema. Pedal pulses are good. Skin is warm and dry. He is alert and oriented x3. Cranial nerves are grossly intact. LABORATORY DATA: INR today is 3.4 ECG today demonstrates atrial fibrillation with a rate of 73 beats per minute. He has left axis deviation and a right bundle branch block. There is marked T wave inversion in leads V5 and V6. Compared to old  ECGs the right bundle branch block is new. Previously he had a nonspecific interventricular conduction delay. Assessment / Plan:

## 2011-04-25 NOTE — Assessment & Plan Note (Signed)
He is status post CABG in 2001. He has no significant anginal symptoms. Continue medical therapy.

## 2011-04-26 ENCOUNTER — Encounter (HOSPITAL_COMMUNITY): Payer: Self-pay | Admitting: *Deleted

## 2011-04-26 ENCOUNTER — Emergency Department (HOSPITAL_COMMUNITY): Admission: EM | Admit: 2011-04-26 | Discharge: 2011-04-26 | Payer: Medicare Other | Source: Home / Self Care

## 2011-04-26 ENCOUNTER — Emergency Department (HOSPITAL_COMMUNITY)
Admission: EM | Admit: 2011-04-26 | Discharge: 2011-04-26 | Disposition: A | Discharge status: Home / Self Care | Payer: Medicare Other | Attending: Emergency Medicine | Admitting: Emergency Medicine

## 2011-04-26 DIAGNOSIS — I509 Heart failure, unspecified: Secondary | ICD-10-CM | POA: Insufficient documentation

## 2011-04-26 DIAGNOSIS — I2789 Other specified pulmonary heart diseases: Secondary | ICD-10-CM | POA: Insufficient documentation

## 2011-04-26 DIAGNOSIS — N189 Chronic kidney disease, unspecified: Secondary | ICD-10-CM | POA: Insufficient documentation

## 2011-04-26 DIAGNOSIS — I129 Hypertensive chronic kidney disease with stage 1 through stage 4 chronic kidney disease, or unspecified chronic kidney disease: Secondary | ICD-10-CM | POA: Insufficient documentation

## 2011-04-26 DIAGNOSIS — Z951 Presence of aortocoronary bypass graft: Secondary | ICD-10-CM | POA: Insufficient documentation

## 2011-04-26 DIAGNOSIS — I5030 Unspecified diastolic (congestive) heart failure: Secondary | ICD-10-CM | POA: Insufficient documentation

## 2011-04-26 DIAGNOSIS — I4891 Unspecified atrial fibrillation: Secondary | ICD-10-CM | POA: Insufficient documentation

## 2011-04-26 DIAGNOSIS — I2510 Atherosclerotic heart disease of native coronary artery without angina pectoris: Secondary | ICD-10-CM | POA: Insufficient documentation

## 2011-04-26 DIAGNOSIS — D649 Anemia, unspecified: Secondary | ICD-10-CM

## 2011-04-26 LAB — CBC
Hemoglobin: 7.8 g/dL — ABNORMAL LOW (ref 13.0–17.0)
MCH: 23.1 pg — ABNORMAL LOW (ref 26.0–34.0)
Platelets: 197 10*3/uL (ref 150–400)
RBC: 3.37 MIL/uL — ABNORMAL LOW (ref 4.22–5.81)
WBC: 6.4 10*3/uL (ref 4.0–10.5)

## 2011-04-26 LAB — RETICULOCYTES
RBC.: 3.37 MIL/uL — ABNORMAL LOW (ref 4.22–5.81)
Retic Ct Pct: 0.8 % (ref 0.4–3.1)

## 2011-04-26 LAB — VITAMIN B12: Vitamin B-12: 733 pg/mL (ref 211–911)

## 2011-04-26 LAB — DIFFERENTIAL
Lymphocytes Relative: 21 % (ref 12–46)
Lymphs Abs: 1.3 10*3/uL (ref 0.7–4.0)
Monocytes Relative: 8 % (ref 3–12)
Neutro Abs: 4.3 10*3/uL (ref 1.7–7.7)
Neutrophils Relative %: 67 % (ref 43–77)

## 2011-04-26 LAB — PROTIME-INR
INR: 3.01 — ABNORMAL HIGH (ref 0.00–1.49)
Prothrombin Time: 31.7 seconds — ABNORMAL HIGH (ref 11.6–15.2)

## 2011-04-26 LAB — IRON AND TIBC
Saturation Ratios: 4 % — ABNORMAL LOW (ref 20–55)
TIBC: 425 ug/dL (ref 215–435)

## 2011-04-26 LAB — FOLATE: Folate: >20 ng/mL

## 2011-04-26 MED ORDER — WARFARIN SODIUM 4 MG PO TABS: ORAL_TABLET | ORAL | Status: DC

## 2011-04-26 MED ORDER — WARFARIN SODIUM 2 MG PO TABS: ORAL_TABLET | ORAL | Status: DC

## 2011-04-26 NOTE — Discharge Instructions (Signed)
[facility] will follow up with you.

## 2011-04-26 NOTE — ED Provider Notes (Signed)
History     CSN: [id number]  Arrival date & time 04/26/11  1114   First MD Initiated Contact with Patient 04/26/11 1137      Chief Complaint  Patient presents with  . Abnormal Lab   level V caveat due to language barrier.  (Consider location/radiation/quality/duration/timing/severity/associated sxs/prior treatment) The history is provided by the patient and a relative.   patient was sent in by cardiology for anemia. Lab work done yesterday showed a hemoglobin of 8. She was supposed to followup with his primary care Dr. But cannot get in until a month now. Patient had a black stool. No fatigue. No chest pain. He was told to go to the urgent care after he could not get in to his primary care. He has a reported history of anemia. His last hemoglobin on record here are all 10.  Past Medical History  Diagnosis Date  . CHF (congestive heart failure)   . History of diastolic dysfunction   . Coronary artery disease   . Chronic renal insufficiency   . Hypertension   . Pulmonary hypertension   . Hyperlipidemia   . Chronic atrial fibrillation   . SBO (small bowel obstruction)     PARTIAL    Past Surgical History  Procedure Date  . Cardiac catheterization 03/31/1999    EF 70%  . Cholecystectomy   . External ear surgery   . Coronary artery bypass graft 2001    LIMA GRAFT TO THE OBTUSE MARGINAL VESSEL, SAPHENOUS VEIN GRAFT TO THE DIAGONAL , SAPHENOUS VEIN GRAFT TO THE PROXIMAL AND DISTAL PDA, AND SAPHENOUS VEIN GRAFT TO THE POSTERIOR LATERAL BRANCH THE RIGHT CORONARY  . Transthoracic echocardiogram 12/17/2007    EF 55-60%  . US echocardiography 05/31/2007    EF 55-60%  . US echocardiography 09/21/2004    EF 55-60%  . Cardiovascular stress test 09/22/2004    EF 53%    Family History  Problem Relation Age of Onset  . Heart attack Mother   . Heart attack Brother   . Hypertension Brother   . Hypertension Sister     History  Substance Use Topics  . Smoking status: Never Smoker     . Smokeless tobacco: Not on file  . Alcohol Use: No      Review of Systems  Unable to perform ROS Respiratory: Negative for shortness of breath.   Cardiovascular: Negative for chest pain and leg swelling.  Gastrointestinal: Negative for blood in stool.  Skin: Negative for rash and wound.  Hematological: Bruises/bleeds easily.    Allergies  Review of patient's allergies indicates no known allergies.  Home Medications   Current Outpatient Rx  Name Route Sig Dispense Refill  . ATENOLOL 50 MG PO TABS Oral Take 1 tablet (50 mg total) by mouth daily. 30 tablet 11  . FUROSEMIDE 40 MG PO TABS Oral Take 1 tablet (40 mg total) by mouth daily. 30 tablet 11  . LORAZEPAM 0.5 MG PO TABS Oral Take 1 tablet (0.5 mg total) by mouth every 8 (eight) hours as needed for anxiety. 90 tablet 3  . CENTRUM SILVER PO Oral Take by mouth.      Marland Kitchen NITROGLYCERIN 0.4 MG SL SUBL Sublingual Place 1 tablet (0.4 mg total) under the tongue every 5 (five) minutes as needed. 25 tablet 11  . OMEPRAZOLE 20 MG PO CPDR  TAKE 1 CAPSULE EACH MORNING 30 capsule 5  . POLYETHYLENE GLYCOL 3350 PO POWD  Take 1 Capful in 4 to 8oz of water or  juice daily 119 g 5  . WARFARIN SODIUM 2 MG PO TABS  2-4 mg daily. Take as directed by the Anticoagulation Clinic. Pt takes 2 mg on Monday, Wednesday and Friday and 4 mg all other days      BP 103/57  Pulse 88  Temp(Src) 98 F (36.7 C) (Oral)  Resp 20  Ht 5' 8.5" (1.74 m)  Wt 153 lb (69.4 kg)  BMI 22.93 kg/m2  SpO2 99%  Physical Exam  Constitutional: He is oriented to person, place, and time. He appears well-developed and well-nourished.  HENT:  Head: Normocephalic.       Patient is wearing hearing aids  Eyes: Pupils are equal, round, and reactive to light.  Cardiovascular: Normal rate and regular rhythm.   Pulmonary/Chest: Effort normal and breath sounds normal.  Abdominal: Soft. He exhibits no distension. There is no tenderness.  Musculoskeletal: Normal range of motion.   Neurological: He is alert and oriented to person, place, and time.    ED Course  Procedures (including critical care time)  Labs Reviewed  RETICULOCYTES - Abnormal; Notable for the following:    RBC. 3.37 (*)    All other components within normal limits  CBC - Abnormal; Notable for the following:    RBC 3.37 (*)    Hemoglobin 7.8 (*)    HCT 26.6 (*)    MCH 23.1 (*)    MCHC 29.3 (*)    RDW 18.1 (*)    All other components within normal limits  PROTIME-INR - Abnormal; Notable for the following:    Prothrombin Time 31.7 (*)    INR 3.01 (*)    All other components within normal limits  DIFFERENTIAL  VITAMIN B12  FOLATE  IRON AND TIBC  FERRITIN   No results found.   1. Anemia       MDM  Patient with anemia. Sent in by cardiology after patient was unable to get seen by primary care Dr. Patient is asymptomatic. His hemoglobin is 7.8. He is on Coumadin, but his denied GI bleeding. Patient refused rectal exam. Discussed with [facility] cardiology. They will see him in followup in the next day or 2 since he has been unable to get primary care followup.        Juliet Rude. Rubin Payor, MD 04/26/11 1321

## 2011-04-26 NOTE — ED Notes (Signed)
Patient presents from PCP's office for a low hemoglobin of near 8. Patient speaks no english, family at bedside translating.  Patient reporting generalized weakness x several days, denies pain, SOB, n/v.  Patient resting quietly, no distress noted.

## 2011-04-26 NOTE — ED Notes (Signed)
Patient had blood work done on yesterday,  He was told his hgb is low.  He was unable to see him primary md so he was sent to [facility] and then to ED for further eval.  Patient denies any sx of low hgb

## 2011-05-02 ENCOUNTER — Encounter: Payer: Self-pay | Admitting: Nurse Practitioner

## 2011-05-02 ENCOUNTER — Other Ambulatory Visit (HOSPITAL_COMMUNITY): Payer: Self-pay | Admitting: *Deleted

## 2011-05-02 ENCOUNTER — Ambulatory Visit (INDEPENDENT_AMBULATORY_CARE_PROVIDER_SITE_OTHER): Payer: Medicare Other | Admitting: Nurse Practitioner

## 2011-05-02 ENCOUNTER — Telehealth: Payer: Self-pay | Admitting: *Deleted

## 2011-05-02 VITALS — BP 110/70 | HR 70 | Ht 69.0 in | Wt 156.0 lb

## 2011-05-02 DIAGNOSIS — D509 Iron deficiency anemia, unspecified: Secondary | ICD-10-CM

## 2011-05-02 DIAGNOSIS — D649 Anemia, unspecified: Secondary | ICD-10-CM

## 2011-05-02 DIAGNOSIS — N189 Chronic kidney disease, unspecified: Secondary | ICD-10-CM

## 2011-05-02 DIAGNOSIS — I4891 Unspecified atrial fibrillation: Secondary | ICD-10-CM

## 2011-05-02 LAB — CBC WITH DIFFERENTIAL/PLATELET
Basophils Absolute: 0.1 10*3/uL (ref 0.0–0.1)
Basophils Relative: 0.8 % (ref 0.0–3.0)
Eosinophils Absolute: 0.2 10*3/uL (ref 0.0–0.7)
Eosinophils Relative: 2.7 % (ref 0.0–5.0)
HCT: 24.8 % — ABNORMAL LOW (ref 39.0–52.0)
Hemoglobin: 7.5 g/dL — CL (ref 13.0–17.0)
Lymphocytes Relative: 12 % (ref 12.0–46.0)
Lymphs Abs: 0.8 10*3/uL (ref 0.7–4.0)
MCHC: 30.3 g/dL (ref 30.0–36.0)
MCV: 78.2 fl (ref 78.0–100.0)
Monocytes Absolute: 0.6 10*3/uL (ref 0.1–1.0)
Monocytes Relative: 9.5 % (ref 3.0–12.0)
Neutro Abs: 4.9 10*3/uL (ref 1.4–7.7)
Neutrophils Relative %: 75 % (ref 43.0–77.0)
Platelets: 196 10*3/uL (ref 150.0–400.0)
RBC: 3.17 Mil/uL — ABNORMAL LOW (ref 4.22–5.81)
RDW: 19.5 % — ABNORMAL HIGH (ref 11.5–14.6)
WBC: 6.6 10*3/uL (ref 4.5–10.5)

## 2011-05-02 LAB — BASIC METABOLIC PANEL
BUN: 58 mg/dL — ABNORMAL HIGH (ref 6–23)
CO2: 25 mEq/L (ref 19–32)
Calcium: 8.6 mg/dL (ref 8.4–10.5)
Chloride: 108 mEq/L (ref 96–112)
Creatinine, Ser: 3.1 mg/dL — ABNORMAL HIGH (ref 0.4–1.5)
GFR: 20.78 mL/min — ABNORMAL LOW (ref >60.00)
Glucose, Bld: 103 mg/dL — ABNORMAL HIGH (ref 70–99)
Potassium: 4.2 mEq/L (ref 3.5–5.1)
Sodium: 142 mEq/L (ref 135–145)

## 2011-05-02 LAB — HEPATIC FUNCTION PANEL
ALT: 13 U/L (ref 0–53)
AST: 18 U/L (ref 0–37)
Albumin: 3.3 g/dL — ABNORMAL LOW (ref 3.5–5.2)
Alkaline Phosphatase: 54 U/L (ref 39–117)
Bilirubin, Direct: 0.1 mg/dL (ref 0.0–0.3)
Total Bilirubin: 0.8 mg/dL (ref 0.3–1.2)
Total Protein: 6.9 g/dL (ref 6.0–8.3)

## 2011-05-02 MED ORDER — FERROUS SULFATE 325 (65 FE) MG PO TABS: 325.0000 mg | ORAL_TABLET | Freq: Three times a day (TID) | ORAL | Status: DC

## 2011-05-02 MED ORDER — PANTOPRAZOLE SODIUM 40 MG PO TBEC: 40.0000 mg | DELAYED_RELEASE_TABLET | Freq: Every day | ORAL | Status: DC

## 2011-05-02 NOTE — Patient Instructions (Addendum)
I want you to stop taking your coumadin  Start iron tablets 3 times a day with meals  Start Protonix 40mg  daily for your stomach  We are going to repeat your labs today. You may end up needing a blood transfusion to help with your blood count.  I will see you in a week.  Call the [facility] office at [phone] if you have any questions, problems or concerns.

## 2011-05-02 NOTE — Progress Notes (Signed)
Blake Arias Date of Birth: 02/11/1924 Medical Record #[id number]  History of Present Illness: Blake Arias is seen today for a work in visit. He is seen for Blake Arias. He is an 78 year old male with multiple medical issues which include CAD, diastolic dysfunction, chronic atrial fib, progressive renal failure and advanced age. He was just here for an evaluation last week. Was noted to be anemic. Seemed fairly asymptomatic. Was referred back to his primary care at [facility] but had not been seen there in over 2 years. Was then referred to the Urgent Care where he refused rectal exam. Hemoglobin down further to 7.8. He remains on his coumadin.  He does not see any other health care provider.  He comes in today. He is here with his wife and daughter. His wife interprets for him.The daughter speaks very little [place]. He speaks very limited and very little [place]. She says he feels good. Says he is not short of breath. No chest pain. No swelling. He is wanting to take some supplement/medicine that is from his home country for his circulation. No obvious bleeding reported. No longer on his PPI. May have caused a rash.   Current Outpatient Prescriptions on File Prior to Visit  Medication Sig Dispense Refill  . atenolol (TENORMIN) 50 MG tablet Take 1 tablet (50 mg total) by mouth daily.  30 tablet  11  . LORazepam (ATIVAN) 0.5 MG tablet Take 1 tablet (0.5 mg total) by mouth every 8 (eight) hours as needed for anxiety.  90 tablet  3  . Multiple Vitamins-Minerals (CENTRUM SILVER PO) Take by mouth.        . nitroGLYCERIN (NITROSTAT) 0.4 MG SL tablet Place 1 tablet (0.4 mg total) under the tongue every 5 (five) minutes as needed.  25 tablet  11  . polyethylene glycol powder (GLYCOLAX/MIRALAX) powder Take 1 Capful in 4 to 8oz of water or juice daily  119 g  5  . warfarin (COUMADIN) 2 MG tablet 2-4 mg daily. Take as directed by the Anticoagulation Clinic. Pt takes 2 mg on Monday, Wednesday and Friday and 4  mg all other days      . DISCONTD: furosemide (LASIX) 40 MG tablet Take 1 tablet (40 mg total) by mouth daily.  30 tablet  11  . ferrous sulfate (FERROUSUL) 325 (65 FE) MG tablet Take 1 tablet (325 mg total) by mouth 3 (three) times daily with meals.  90 tablet  11  . pantoprazole (PROTONIX) 40 MG tablet Take 1 tablet (40 mg total) by mouth daily.  30 tablet  6    No Known Allergies  Past Medical History  Diagnosis Date  . CHF (congestive heart failure)     secondary to diastolic dysfunction; last echo in 2009. Normal EF.   Marland Kitchen History of diastolic dysfunction   . Coronary artery disease     s/p CABG in 2001 with LIMA to OM, SVG to DX, SVG to prox & distal PD  . Chronic renal insufficiency   . Hypertension   . Pulmonary hypertension   . Hyperlipidemia   . Chronic atrial fibrillation   . SBO (small bowel obstruction)     PARTIAL  . Chronic anticoagulation   . Anemia     Past Surgical History  Procedure Date  . Cardiac catheterization 03/31/1999    EF 70%  . Cholecystectomy   . External ear surgery   . Coronary artery bypass graft 2001    LIMA GRAFT TO THE OBTUSE  MARGINAL VESSEL, SAPHENOUS VEIN GRAFT TO THE DIAGONAL , SAPHENOUS VEIN GRAFT TO THE PROXIMAL AND DISTAL PDA, AND SAPHENOUS VEIN GRAFT TO THE POSTERIOR LATERAL BRANCH THE RIGHT CORONARY  . Transthoracic echocardiogram 12/17/2007    EF 55-60%  . US echocardiography 05/31/2007    EF 55-60%  . US echocardiography 09/21/2004    EF 55-60%  . Cardiovascular stress test 09/22/2004    EF 53%    History  Smoking status  . Never Smoker   Smokeless tobacco  . Not on file    History  Alcohol Use No    Family History  Problem Relation Age of Onset  . Heart attack Mother   . Heart attack Brother   . Hypertension Brother   . Hypertension Sister     Review of Systems: The review of systems is per the HPI.  All other systems were reviewed and are negative.  Physical Exam: BP 110/70  Pulse 70  Ht 5\' 9"  (1.753 m)   Wt 156 lb (70.761 kg)  BMI 23.04 kg/m2 Patient is alert and in no acute distress. Skin is warm and dry. Color is quite sallow and almost a little yellow.  HEENT is unremarkable except for very poor dentition. Has several missing teeth. Normocephalic/atraumatic. PERRL. Sclera are nonicteric. Neck is supple. No masses. No JVD. Lungs are clear. Cardiac exam shows an irregular rhythm. Rate is controlled. Systolic murmur noted at the apex. Abdomen is soft. Extremities are without edema. Gait and ROM are intact. He is using a cane.  No gross neurologic deficits noted.  LABORATORY DATA:   Chemistry      Component Value Date/Time   NA 139 04/25/2011 1034   K 4.4 04/25/2011 1034   CL 103 04/25/2011 1034   CO2 28 04/25/2011 1034   BUN 68* 04/25/2011 1034   CREATININE 3.0* 04/25/2011 1034      Component Value Date/Time   CALCIUM 8.7 04/25/2011 1034   ALKPHOS 55 04/25/2011 1034   AST 18 04/25/2011 1034   ALT 11 04/25/2011 1034   BILITOT 0.6 04/25/2011 1034     Lab Results  Component Value Date   WBC 6.4 04/26/2011   HGB 7.8* 04/26/2011   HCT 26.6* 04/26/2011   MCV 78.9 04/26/2011   PLT 197 04/26/2011   Lab Results  Component Value Date   IRON 15* 04/26/2011   TIBC 425 04/26/2011   FERRITIN 12* 04/26/2011   Lab Results  Component Value Date   VITAMINB12 733 04/26/2011    Lab Results  Component Value Date   INR 3.01* 04/26/2011   INR 3.4 04/25/2011   INR 2.7 03/13/2011    Repeat labs today are pending.   Assessment / Plan:

## 2011-05-02 NOTE — Telephone Encounter (Signed)
Phoned ms Dever and advised that mr opara should be at [facility] at 9:00am for blood transfusion--i do not know if she comprehended my instructions--i have attempted to call the number given by [place] to reach an interpreter but no one has returned my call--nt

## 2011-05-02 NOTE — Assessment & Plan Note (Signed)
Last creatinine up to 3.0. Does not see renal. Will recheck today. I think his overall prognosis looks poor given his multiple comorbidities.

## 2011-05-02 NOTE — Assessment & Plan Note (Signed)
This is chronic. Rate is controlled. We are stopping the coumadin for now. Hopefully this will just be temporary til we sort his anemia and GI status out.

## 2011-05-02 NOTE — Assessment & Plan Note (Addendum)
Patient appears to be iron deficient. He apparently took some iron tablets in the past. Never seen GI. No history of colonoscopy.  No obvious bleeding reported but the language barrier makes questioning him very difficult. Does not have any other health care provider. I do not get the feeling he will go see anyone else as well. I am rechecking his labs today. I have stopped his coumadin for now. INR last week was slightly over his therapeutic range. May need to consider transfusion. I have restarted a PPI as well. I will tentatively see him back in one week. His overall prognosis is guarded. I have tried to explain the seriousness of his situation to him and his wife and daughter. Patient and wife are agreeable to this plan and will call if any problems develop in the interim.   Have received phone call from the lab. Hemoglobin down to 7.5 today. Have reviewed with Dr. Shirlee Latch who agrees with stopping the coumadin. Will go ahead and arrange for transfusion tomorrow of 1 unit of PRBC's over 3 hours. Will need to see GI but I do not think he will follow thru. Will ask Dr. Swaziland for his input tomorrow.

## 2011-05-03 ENCOUNTER — Ambulatory Visit (HOSPITAL_COMMUNITY)
Admission: RE | Admit: 2011-05-03 | Discharge: 2011-05-03 | Disposition: A | Discharge status: Home / Self Care | Payer: Medicare Other | Source: Ambulatory Visit | Attending: Cardiology | Admitting: Cardiology

## 2011-05-03 DIAGNOSIS — D649 Anemia, unspecified: Secondary | ICD-10-CM | POA: Insufficient documentation

## 2011-05-03 LAB — ABO/RH: ABO/RH(D): O POS

## 2011-05-03 LAB — PREPARE RBC (CROSSMATCH)

## 2011-05-04 LAB — TYPE AND SCREEN
Antibody Screen: NEGATIVE
Unit division: 0

## 2011-05-09 ENCOUNTER — Encounter: Payer: Self-pay | Admitting: Nurse Practitioner

## 2011-05-09 ENCOUNTER — Ambulatory Visit (INDEPENDENT_AMBULATORY_CARE_PROVIDER_SITE_OTHER): Payer: Medicare Other | Admitting: Nurse Practitioner

## 2011-05-09 VITALS — BP 120/64 | HR 70 | Ht 68.0 in | Wt 159.0 lb

## 2011-05-09 DIAGNOSIS — I482 Chronic atrial fibrillation, unspecified: Secondary | ICD-10-CM

## 2011-05-09 DIAGNOSIS — D509 Iron deficiency anemia, unspecified: Secondary | ICD-10-CM

## 2011-05-09 DIAGNOSIS — D649 Anemia, unspecified: Secondary | ICD-10-CM

## 2011-05-09 DIAGNOSIS — I4891 Unspecified atrial fibrillation: Secondary | ICD-10-CM

## 2011-05-09 LAB — CBC WITH DIFFERENTIAL/PLATELET
Basophils Absolute: 0.1 10*3/uL (ref 0.0–0.1)
Basophils Relative: 1.1 % (ref 0.0–3.0)
Eosinophils Absolute: 0.1 10*3/uL (ref 0.0–0.7)
Eosinophils Relative: 2 % (ref 0.0–5.0)
HCT: 27.4 % — ABNORMAL LOW (ref 39.0–52.0)
Hemoglobin: 8.3 g/dL — ABNORMAL LOW (ref 13.0–17.0)
Lymphocytes Relative: 21.4 % (ref 12.0–46.0)
Lymphs Abs: 1.3 10*3/uL (ref 0.7–4.0)
MCHC: 30.3 g/dL (ref 30.0–36.0)
MCV: 80.2 fl (ref 78.0–100.0)
Monocytes Absolute: 0.6 10*3/uL (ref 0.1–1.0)
Monocytes Relative: 10 % (ref 3.0–12.0)
Neutro Abs: 4.1 10*3/uL (ref 1.4–7.7)
Neutrophils Relative %: 65.5 % (ref 43.0–77.0)
Platelets: 169 10*3/uL (ref 150.0–400.0)
RBC: 3.42 Mil/uL — ABNORMAL LOW (ref 4.22–5.81)
RDW: 19.5 % — ABNORMAL HIGH (ref 11.5–14.6)
WBC: 6.3 10*3/uL (ref 4.5–10.5)

## 2011-05-09 LAB — BASIC METABOLIC PANEL
BUN: 48 mg/dL — ABNORMAL HIGH (ref 6–23)
CO2: 24 mEq/L (ref 19–32)
Calcium: 8.7 mg/dL (ref 8.4–10.5)
Chloride: 105 mEq/L (ref 96–112)
Creatinine, Ser: 3 mg/dL — ABNORMAL HIGH (ref 0.4–1.5)
GFR: 21.6 mL/min — ABNORMAL LOW (ref >60.00)
Glucose, Bld: 87 mg/dL (ref 70–99)
Potassium: 4.7 mEq/L (ref 3.5–5.1)
Sodium: 140 mEq/L (ref 135–145)

## 2011-05-09 NOTE — Assessment & Plan Note (Signed)
We are rechecking his labs today. I have asked his wife to give him the iron tablets. He refuses to see GI and does not wish for evaluation. I have left him off of his coumadin. Will see him back in one month with Dr. Swaziland. Patient is agreeable to this plan and will call if any problems develop in the interim.

## 2011-05-09 NOTE — Progress Notes (Signed)
Blake Arias Date of Birth: 10-06-1924 Medical Record #[id number]  History of Present Illness: Blake Arias is seen today for a one week check. He is seen for Blake Arias. He has multiple medical issues which include CAD, diastolic dysfunction, chronic atrial fib, progressive renal failure and advanced age. He has had worsening anemia and was transfused last week. I stopped his coumadin. He refuses to see Gi. Still trying to get in at [facility] and get reestablished. He feels good. His wife is here and she is the interpreter for him. She says he feels good, not short of breath, no chest pain and not dizzy or lightheaded. No obvious bleeding. He is not taking his coumadin. Can't tell if she is giving him iron tablets. He is back on some PPI.  Current Outpatient Prescriptions on File Prior to Visit  Medication Sig Dispense Refill  . atenolol (TENORMIN) 50 MG tablet Take 1 tablet (50 mg total) by mouth daily.  30 tablet  11  . furosemide (LASIX) 40 MG tablet Take 20 mg by mouth daily.      Marland Kitchen LORazepam (ATIVAN) 0.5 MG tablet Take 1 tablet (0.5 mg total) by mouth every 8 (eight) hours as needed for anxiety.  90 tablet  3  . Multiple Vitamins-Minerals (CENTRUM SILVER PO) Take by mouth.        . nitroGLYCERIN (NITROSTAT) 0.4 MG SL tablet Place 1 tablet (0.4 mg total) under the tongue every 5 (five) minutes as needed.  25 tablet  11  . pantoprazole (PROTONIX) 40 MG tablet Take 1 tablet (40 mg total) by mouth daily.  30 tablet  6  . polyethylene glycol powder (GLYCOLAX/MIRALAX) powder Take 1 Capful in 4 to 8oz of water or juice daily  119 g  5    No Known Allergies  Past Medical History  Diagnosis Date  . CHF (congestive heart failure)     secondary to diastolic dysfunction; last echo in 2009. Normal EF.   Marland Kitchen History of diastolic dysfunction   . Coronary artery disease     s/p CABG in 2001 with LIMA to OM, SVG to DX, SVG to prox & distal PD  . Chronic renal insufficiency   . Hypertension   .  Pulmonary hypertension   . Hyperlipidemia   . Chronic atrial fibrillation   . SBO (small bowel obstruction)     PARTIAL  . Chronic anticoagulation     stopped coumadin due to profound anemia 05/02/2011  . Anemia     Refuses GI workup    Past Surgical History  Procedure Date  . Cardiac catheterization 03/31/1999    EF 70%  . Cholecystectomy   . External ear surgery   . Coronary artery bypass graft 2001    LIMA GRAFT TO THE OBTUSE MARGINAL VESSEL, SAPHENOUS VEIN GRAFT TO THE DIAGONAL , SAPHENOUS VEIN GRAFT TO THE PROXIMAL AND DISTAL PDA, AND SAPHENOUS VEIN GRAFT TO THE POSTERIOR LATERAL BRANCH THE RIGHT CORONARY  . Transthoracic echocardiogram 12/17/2007    EF 55-60%  . US echocardiography 05/31/2007    EF 55-60%  . US echocardiography 09/21/2004    EF 55-60%  . Cardiovascular stress test 09/22/2004    EF 53%    History  Smoking status  . Never Smoker   Smokeless tobacco  . Not on file    History  Alcohol Use No    Family History  Problem Relation Age of Onset  . Heart attack Mother   . Heart attack Brother   .  Hypertension Brother   . Hypertension Sister     Review of Systems: The review of systems is per the HPI.  All other systems were reviewed and are negative.  Physical Exam: BP 120/64  Pulse 70  Ht 5\' 8"  (1.727 m)  Wt 159 lb (72.122 kg)  BMI 24.18 kg/m2 Patient is very pleasant and in no acute distress. He does not speak [place]. Skin is warm and dry. Color is normal and not as sallow.  HEENT is unremarkable but with very poor dentition. Normocephalic/atraumatic. PERRL. Sclera are nonicteric. Neck is supple. No masses. No JVD. Lungs are clear. Cardiac exam shows an irregular rhythm. Rate is controlled. Systolic murmur noted. Abdomen is soft. Extremities are without edema. Gait and ROM are intact. No gross neurologic deficits noted.   LABORATORY DATA: Lab Results  Component Value Date   WBC 6.6 05/02/2011   HGB 7.5 cL* 05/02/2011   HCT 24.8* 05/02/2011    MCV 78.2 05/02/2011   PLT 196.0 05/02/2011     Chemistry      Component Value Date/Time   NA 142 05/02/2011 0903   K 4.2 05/02/2011 0903   CL 108 05/02/2011 0903   CO2 25 05/02/2011 0903   BUN 58* 05/02/2011 0903   CREATININE 3.1* 05/02/2011 0903      Component Value Date/Time   CALCIUM 8.6 05/02/2011 0903   ALKPHOS 54 05/02/2011 0903   AST 18 05/02/2011 0903   ALT 13 05/02/2011 0903   BILITOT 0.8 05/02/2011 0903        Assessment / Plan:

## 2011-05-09 NOTE — Patient Instructions (Signed)
Stay off the coumadin  We are rechecking labs today  I want to see you in a month with Dr. Swaziland.   Call the [facility] office at [phone] if you have any questions, problems or concerns.

## 2011-05-09 NOTE — Assessment & Plan Note (Signed)
He has chronic atrial fib. Rate is controlled. Unfortunately his stroke risk is high and I have left him off of his coumadin. He refuses to see GI. Will see what his labs look like. We will see him back in a month.

## 2011-05-10 ENCOUNTER — Other Ambulatory Visit: Payer: Self-pay

## 2011-05-10 DIAGNOSIS — D649 Anemia, unspecified: Secondary | ICD-10-CM

## 2011-05-23 ENCOUNTER — Other Ambulatory Visit (INDEPENDENT_AMBULATORY_CARE_PROVIDER_SITE_OTHER): Payer: Medicare Other

## 2011-05-23 ENCOUNTER — Other Ambulatory Visit: Payer: Self-pay

## 2011-05-23 DIAGNOSIS — I2510 Atherosclerotic heart disease of native coronary artery without angina pectoris: Secondary | ICD-10-CM

## 2011-05-23 DIAGNOSIS — D649 Anemia, unspecified: Secondary | ICD-10-CM

## 2011-05-23 LAB — CBC WITH DIFFERENTIAL/PLATELET
Basophils Absolute: 0 10*3/uL (ref 0.0–0.1)
Basophils Relative: 0.4 % (ref 0.0–3.0)
Eosinophils Absolute: 0.2 10*3/uL (ref 0.0–0.7)
Eosinophils Relative: 4.6 % (ref 0.0–5.0)
HCT: 29.5 % — ABNORMAL LOW (ref 39.0–52.0)
Hemoglobin: 9.1 g/dL — ABNORMAL LOW (ref 13.0–17.0)
Lymphocytes Relative: 20.7 % (ref 12.0–46.0)
Lymphs Abs: 1.1 10*3/uL (ref 0.7–4.0)
MCHC: 30.9 g/dL (ref 30.0–36.0)
MCV: 83 fl (ref 78.0–100.0)
Monocytes Absolute: 0.5 10*3/uL (ref 0.1–1.0)
Monocytes Relative: 10.3 % (ref 3.0–12.0)
Neutro Abs: 3.4 10*3/uL (ref 1.4–7.7)
Neutrophils Relative %: 64 % (ref 43.0–77.0)
Platelets: 165 10*3/uL (ref 150.0–400.0)
RBC: 3.55 Mil/uL — ABNORMAL LOW (ref 4.22–5.81)
RDW: 26.5 % — ABNORMAL HIGH (ref 11.5–14.6)
WBC: 5.3 10*3/uL (ref 4.5–10.5)

## 2011-05-24 ENCOUNTER — Other Ambulatory Visit: Payer: Medicare Other

## 2011-06-06 ENCOUNTER — Other Ambulatory Visit (INDEPENDENT_AMBULATORY_CARE_PROVIDER_SITE_OTHER): Payer: Medicare Other

## 2011-06-06 DIAGNOSIS — I2510 Atherosclerotic heart disease of native coronary artery without angina pectoris: Secondary | ICD-10-CM

## 2011-06-06 LAB — CBC WITH DIFFERENTIAL/PLATELET
Basophils Absolute: 0 10*3/uL (ref 0.0–0.1)
Eosinophils Relative: 3.3 % (ref 0.0–5.0)
HCT: 31.2 % — ABNORMAL LOW (ref 39.0–52.0)
Hemoglobin: 9.8 g/dL — ABNORMAL LOW (ref 13.0–17.0)
Lymphocytes Relative: 23.1 % (ref 12.0–46.0)
Lymphs Abs: 1.2 10*3/uL (ref 0.7–4.0)
Monocytes Relative: 8.5 % (ref 3.0–12.0)
Neutro Abs: 3.4 10*3/uL (ref 1.4–7.7)
Platelets: 137 10*3/uL — ABNORMAL LOW (ref 150.0–400.0)
RDW: 28.6 % — ABNORMAL HIGH (ref 11.5–14.6)
WBC: 5.2 10*3/uL (ref 4.5–10.5)

## 2011-06-13 ENCOUNTER — Ambulatory Visit (INDEPENDENT_AMBULATORY_CARE_PROVIDER_SITE_OTHER): Payer: Medicare Other | Admitting: Nurse Practitioner

## 2011-06-13 ENCOUNTER — Encounter: Payer: Self-pay | Admitting: Nurse Practitioner

## 2011-06-13 VITALS — BP 92/56 | HR 81 | Ht 74.0 in | Wt 151.8 lb

## 2011-06-13 DIAGNOSIS — D649 Anemia, unspecified: Secondary | ICD-10-CM

## 2011-06-13 DIAGNOSIS — I509 Heart failure, unspecified: Secondary | ICD-10-CM

## 2011-06-13 DIAGNOSIS — I4891 Unspecified atrial fibrillation: Secondary | ICD-10-CM

## 2011-06-13 DIAGNOSIS — R634 Abnormal weight loss: Secondary | ICD-10-CM

## 2011-06-13 DIAGNOSIS — I482 Chronic atrial fibrillation, unspecified: Secondary | ICD-10-CM

## 2011-06-13 DIAGNOSIS — Z7901 Long term (current) use of anticoagulants: Secondary | ICD-10-CM

## 2011-06-13 LAB — BASIC METABOLIC PANEL
BUN: 41 mg/dL — ABNORMAL HIGH (ref 6–23)
CO2: 30 mEq/L (ref 19–32)
Calcium: 8.6 mg/dL (ref 8.4–10.5)
Chloride: 105 mEq/L (ref 96–112)
Creatinine, Ser: 3 mg/dL — ABNORMAL HIGH (ref 0.4–1.5)
GFR: 21.1 mL/min — ABNORMAL LOW (ref >60.00)
Glucose, Bld: 89 mg/dL (ref 70–99)
Potassium: 4.7 mEq/L (ref 3.5–5.1)
Sodium: 142 mEq/L (ref 135–145)

## 2011-06-13 LAB — HEPATIC FUNCTION PANEL
ALT: 11 U/L (ref 0–53)
AST: 19 U/L (ref 0–37)
Albumin: 3.5 g/dL (ref 3.5–5.2)
Alkaline Phosphatase: 57 U/L (ref 39–117)
Bilirubin, Direct: 0.1 mg/dL (ref 0.0–0.3)
Total Bilirubin: 0.6 mg/dL (ref 0.3–1.2)
Total Protein: 6.8 g/dL (ref 6.0–8.3)

## 2011-06-13 LAB — CBC WITH DIFFERENTIAL/PLATELET
Basophils Absolute: 0 10*3/uL (ref 0.0–0.1)
Basophils Relative: 0.6 % (ref 0.0–3.0)
Eosinophils Absolute: 0.4 10*3/uL (ref 0.0–0.7)
Eosinophils Relative: 6.3 % — ABNORMAL HIGH (ref 0.0–5.0)
HCT: 34.4 % — ABNORMAL LOW (ref 39.0–52.0)
Hemoglobin: 10.7 g/dL — ABNORMAL LOW (ref 13.0–17.0)
Lymphocytes Relative: 21.6 % (ref 12.0–46.0)
Lymphs Abs: 1.5 10*3/uL (ref 0.7–4.0)
MCHC: 31.2 g/dL (ref 30.0–36.0)
MCV: 87.6 fl (ref 78.0–100.0)
Monocytes Absolute: 0.6 10*3/uL (ref 0.1–1.0)
Monocytes Relative: 8.8 % (ref 3.0–12.0)
Neutro Abs: 4.3 10*3/uL (ref 1.4–7.7)
Neutrophils Relative %: 62.7 % (ref 43.0–77.0)
Platelets: 161 10*3/uL (ref 150.0–400.0)
RBC: 3.93 Mil/uL — ABNORMAL LOW (ref 4.22–5.81)
RDW: 28 % — ABNORMAL HIGH (ref 11.5–14.6)
WBC: 6.8 10*3/uL (ref 4.5–10.5)

## 2011-06-13 NOTE — Assessment & Plan Note (Signed)
He has no symptoms at this time.

## 2011-06-13 NOTE — Patient Instructions (Signed)
Stay on your current medicines.  We will see him in 3 months  We are going to recheck the labs today.  Call the [facility] office at [phone] if you have any questions, problems or concerns.

## 2011-06-13 NOTE — Assessment & Plan Note (Signed)
His weight is down another 8 pounds. He refuses further evaluation. I suspect he has some malignancy. We are rechecking labs today. Will see him back in 3 months and be available as needed.

## 2011-06-13 NOTE — Progress Notes (Signed)
Blake Arias Date of Birth: May 06, 1924 Medical Record #[id number]  History of Present Illness: Blake Arias is seen today for a one month check. He is seen for Dr. Swaziland. He has multiple medical issues which include CAD, diastolic dysfunction, chronic atrial fib, progressive renal failure and advanced age. He has had recent worsening anemia and has been transfused. He is off of his coumadin. He has refused to see GI. He is also losing weight.  He comes in today. He is here with his wife and daughter. The wife interprets for him. She says he is doing ok and feels well. No chest pain. Not weak or dizzy. No pain. Not really aware that he is losing weight. She is giving him his iron.   Current Outpatient Prescriptions on File Prior to Visit  Medication Sig Dispense Refill  . atenolol (TENORMIN) 50 MG tablet Take 1 tablet (50 mg total) by mouth daily.  30 tablet  11  . furosemide (LASIX) 40 MG tablet Take 20 mg by mouth daily.      Marland Kitchen LORazepam (ATIVAN) 0.5 MG tablet Take 1 tablet (0.5 mg total) by mouth every 8 (eight) hours as needed for anxiety.  90 tablet  3  . Multiple Vitamins-Minerals (CENTRUM SILVER PO) Take by mouth.        . nitroGLYCERIN (NITROSTAT) 0.4 MG SL tablet Place 1 tablet (0.4 mg total) under the tongue every 5 (five) minutes as needed.  25 tablet  11  . pantoprazole (PROTONIX) 40 MG tablet Take 1 tablet (40 mg total) by mouth daily.  30 tablet  6  . polyethylene glycol powder (GLYCOLAX/MIRALAX) powder Take 1 Capful in 4 to 8oz of water or juice daily  119 g  5    No Known Allergies  Past Medical History  Diagnosis Date  . CHF (congestive heart failure)     secondary to diastolic dysfunction; last echo in 2009. Normal EF.   Marland Kitchen History of diastolic dysfunction   . Coronary artery disease     s/p CABG in 2001 with LIMA to OM, SVG to DX, SVG to prox & distal PD  . Chronic renal insufficiency   . Hypertension   . Pulmonary hypertension   . Hyperlipidemia   . Chronic atrial  fibrillation   . SBO (small bowel obstruction)     PARTIAL  . Chronic anticoagulation     stopped coumadin due to profound anemia 05/02/2011  . Anemia     Refuses GI workup  . Weight loss     Past Surgical History  Procedure Date  . Cardiac catheterization 03/31/1999    EF 70%  . Cholecystectomy   . External ear surgery   . Coronary artery bypass graft 2001    LIMA GRAFT TO THE OBTUSE MARGINAL VESSEL, SAPHENOUS VEIN GRAFT TO THE DIAGONAL , SAPHENOUS VEIN GRAFT TO THE PROXIMAL AND DISTAL PDA, AND SAPHENOUS VEIN GRAFT TO THE POSTERIOR LATERAL BRANCH THE RIGHT CORONARY  . Transthoracic echocardiogram 12/17/2007    EF 55-60%  . US echocardiography 05/31/2007    EF 55-60%  . US echocardiography 09/21/2004    EF 55-60%  . Cardiovascular stress test 09/22/2004    EF 53%    History  Smoking status  . Never Smoker   Smokeless tobacco  . Not on file    History  Alcohol Use No    Family History  Problem Relation Age of Onset  . Heart attack Mother   . Heart attack Brother   . Hypertension  Brother   . Hypertension Sister     Review of Systems: The review of systems is per the HPI.  All other systems were reviewed and are negative.  Physical Exam: BP 92/56  Pulse 81  Ht 6\' 2"  (1.88 m)  Wt 151 lb 12.8 oz (68.856 kg)  BMI 19.49 kg/m2  SpO2 94% Patient is very pleasant and in no acute distress. Weight is down 8 pounds. He looks ill. Skin is warm and dry. Color is sallow.  HEENT is unremarkable but has lots of missing teeth. Normocephalic/atraumatic. PERRL. Sclera are nonicteric. Neck is supple. No masses. No JVD. Lungs are clear. Cardiac exam shows an irregular rhythm. Rate is controlled. Abdomen is soft. Extremities are without edema. Gait and ROM are intact. No gross neurologic deficits noted.   LABORATORY DATA: Repeat labs are pending  Lab Results  Component Value Date   WBC 5.2 06/06/2011   HGB 9.8* 06/06/2011   HCT 31.2* 06/06/2011   MCV 87.5 06/06/2011   PLT 137.0*  06/06/2011       Assessment / Plan:

## 2011-06-13 NOTE — Assessment & Plan Note (Addendum)
He is managed with rate control only. No longer a candidate for coumadin given his anemia and refusal to see GI.

## 2011-07-03 ENCOUNTER — Other Ambulatory Visit: Payer: Self-pay | Admitting: Cardiology

## 2011-07-04 NOTE — Telephone Encounter (Signed)
Pt's wife rtn cheryl's call, pls call [phone]

## 2011-07-04 NOTE — Telephone Encounter (Signed)
Spoke to patient's wife stated patient needed refill on lorazepam 0.5.mg.Prescription sent to [facility] on [address].

## 2011-07-07 ENCOUNTER — Other Ambulatory Visit: Payer: Self-pay | Admitting: Cardiology

## 2011-07-07 MED ORDER — LORAZEPAM 0.5 MG PO TABS: 0.5000 mg | ORAL_TABLET | Freq: Three times a day (TID) | ORAL | Status: DC | PRN

## 2011-07-07 NOTE — Telephone Encounter (Signed)
Fax Received. Refill Completed. Blake Arias (R.M.A)   

## 2011-07-07 NOTE — Telephone Encounter (Signed)
Fu call Pt's wife calling back about lorazepam refill again

## 2011-07-10 NOTE — Telephone Encounter (Signed)
Fu call °Pt's wife calling back again °

## 2011-07-10 NOTE — Telephone Encounter (Signed)
New Problem:    Calling in needing a refill of the patient's medication.  Translator did not know the name of the patient's medication.  Please call back if you have any questions.

## 2011-07-11 MED ORDER — LORAZEPAM 0.5 MG PO TABS: 0.5000 mg | ORAL_TABLET | Freq: Three times a day (TID) | ORAL | Status: DC | PRN

## 2011-07-11 NOTE — Telephone Encounter (Signed)
Lorazepam 0.5 mg refill called in to [facility] [phone] refill x 3.

## 2011-08-21 ENCOUNTER — Ambulatory Visit: Payer: Self-pay | Admitting: Cardiovascular Disease

## 2011-08-21 DIAGNOSIS — I4891 Unspecified atrial fibrillation: Secondary | ICD-10-CM

## 2011-09-13 ENCOUNTER — Encounter: Payer: Self-pay | Admitting: Cardiology

## 2011-09-13 ENCOUNTER — Ambulatory Visit (INDEPENDENT_AMBULATORY_CARE_PROVIDER_SITE_OTHER): Payer: Medicare Other | Admitting: Cardiology

## 2011-09-13 VITALS — BP 100/52 | HR 70 | Ht 74.0 in | Wt 151.1 lb

## 2011-09-13 DIAGNOSIS — I2510 Atherosclerotic heart disease of native coronary artery without angina pectoris: Secondary | ICD-10-CM

## 2011-09-13 DIAGNOSIS — I482 Chronic atrial fibrillation, unspecified: Secondary | ICD-10-CM

## 2011-09-13 DIAGNOSIS — Z951 Presence of aortocoronary bypass graft: Secondary | ICD-10-CM

## 2011-09-13 DIAGNOSIS — I509 Heart failure, unspecified: Secondary | ICD-10-CM

## 2011-09-13 DIAGNOSIS — I10 Essential (primary) hypertension: Secondary | ICD-10-CM

## 2011-09-13 DIAGNOSIS — I4891 Unspecified atrial fibrillation: Secondary | ICD-10-CM

## 2011-09-13 MED ORDER — LORAZEPAM 0.5 MG PO TABS: 0.5000 mg | ORAL_TABLET | Freq: Three times a day (TID) | ORAL | Status: DC | PRN

## 2011-09-13 NOTE — Patient Instructions (Addendum)
Continue your current therapy  I will see you again in 6 months.   

## 2011-09-13 NOTE — Progress Notes (Signed)
Blake Arias Date of Birth: 02/28/1924 Medical Record #[id number]  History of Present Illness: Blake Arias is seen today for a followup visit. He has multiple medical issues which include CAD, diastolic dysfunction, chronic atrial fib, progressive renal failure and advanced age. He has a history of anemia requiring transfusion. He is off of his coumadin. He has refused to see GI. He comes in today. He is here with his wife and daughter. They report that he is doing very well. He denies any significant shortness of breath, chest pain, or palpitations. They admit that his energy level is decreased. He has had no edema. He denies any dizziness or syncope. His appetite is good but he eats a soft diet because of difficulty swallowing.  Current Outpatient Prescriptions on File Prior to Visit  Medication Sig Dispense Refill  . atenolol (TENORMIN) 50 MG tablet Take 1 tablet (50 mg total) by mouth daily.  30 tablet  11  . ferrous sulfate 325 (65 FE) MG tablet Take 325 mg by mouth 2 (two) times daily.      . furosemide (LASIX) 40 MG tablet Take 20 mg by mouth daily.      Marland Kitchen LORazepam (ATIVAN) 0.5 MG tablet Take 1 tablet (0.5 mg total) by mouth every 8 (eight) hours as needed for anxiety.  90 tablet  3  . Multiple Vitamins-Minerals (CENTRUM SILVER PO) Take by mouth.        . nitroGLYCERIN (NITROSTAT) 0.4 MG SL tablet Place 1 tablet (0.4 mg total) under the tongue every 5 (five) minutes as needed.  25 tablet  11  . pantoprazole (PROTONIX) 40 MG tablet Take 1 tablet (40 mg total) by mouth daily.  30 tablet  6  . polyethylene glycol powder (GLYCOLAX/MIRALAX) powder Take 1 Capful in 4 to 8oz of water or juice daily  119 g  5    No Known Allergies  Past Medical History  Diagnosis Date  . CHF (congestive heart failure)     secondary to diastolic dysfunction; last echo in 2009. Normal EF.   Marland Kitchen History of diastolic dysfunction   . Coronary artery disease     s/p CABG in 2001 with LIMA to OM, SVG to DX, SVG to  prox & distal PD  . Chronic renal insufficiency   . Hypertension   . Pulmonary hypertension   . Hyperlipidemia   . Chronic atrial fibrillation   . SBO (small bowel obstruction)     PARTIAL  . Chronic anticoagulation     stopped coumadin due to profound anemia 05/02/2011  . Anemia     Refuses GI workup  . Weight loss     Past Surgical History  Procedure Date  . Cardiac catheterization 03/31/1999    EF 70%  . Cholecystectomy   . External ear surgery   . Coronary artery bypass graft 2001    LIMA GRAFT TO THE OBTUSE MARGINAL VESSEL, SAPHENOUS VEIN GRAFT TO THE DIAGONAL , SAPHENOUS VEIN GRAFT TO THE PROXIMAL AND DISTAL PDA, AND SAPHENOUS VEIN GRAFT TO THE POSTERIOR LATERAL BRANCH THE RIGHT CORONARY  . Transthoracic echocardiogram 12/17/2007    EF 55-60%  . US echocardiography 05/31/2007    EF 55-60%  . US echocardiography 09/21/2004    EF 55-60%  . Cardiovascular stress test 09/22/2004    EF 53%    History  Smoking status  . Never Smoker   Smokeless tobacco  . Not on file    History  Alcohol Use No    Family History  Problem Relation Age of Onset  . Heart attack Mother   . Heart attack Brother   . Hypertension Brother   . Hypertension Sister     Review of Systems: The review of systems is per the HPI.  All other systems were reviewed and are negative.  Physical Exam: BP 100/52  Pulse 70  Ht 6\' 2"  (1.88 m)  Wt 68.548 kg (151 lb 1.9 oz)  BMI 19.40 kg/m2  SpO2 93% Patient is very pleasant and in no acute distress. Weight is stable today.  Skin is warm and dry. HEENT is unremarkable but has lots of missing teeth. Normocephalic/atraumatic. PERRL. Sclera are nonicteric. Neck is supple. No masses. No JVD. Lungs are clear. Cardiac exam shows an irregular rhythm. Rate is controlled. Abdomen is soft. Extremities are without edema. Gait and ROM are intact. No gross neurologic deficits noted.   LABORATORY DATA:    Assessment / Plan: 1. Congestive heart failure with  chronic diastolic dysfunction. He appears to be well compensated today. We will continue with Lasix 20 mg daily.  2. Atrial fibrillation, permanent. Rate is well controlled on atenolol. He is not a candidate for anticoagulation given his profound anemia requiring transfusion. He has refused GI evaluation.  3. Chronic kidney disease stage IV.  4. Coronary disease status post CABG in 2001. Denies significant anginal symptoms.  5. Chronic anemia. Patient has refused GI workup. Continue iron supplementation. At least his weight has stabilized.

## 2011-10-12 IMAGING — CT CT ABD-PELV W/O CM
2 of 4 series · 16 of 46 positions shown, 18 images · non-contrast
Comparison: Radiography same day.  Ultrasound 02/16/2009

CLINICAL DATA: Acute onset of abdominal pain which is worsening.

CT ABDOMEN AND PELVIS WITHOUT CONTRAST
TECHNIQUE: Multidetector CT imaging of the abdomen and pelvis was
performed following the standard protocol without intravenous
contrast.

[Series 2: routine abdomen · axial · 0.76mm/px · z∈[-449,-39]mm · 13 of 90 slices shown, 15 images]
[im 4/90  soft-tissue]
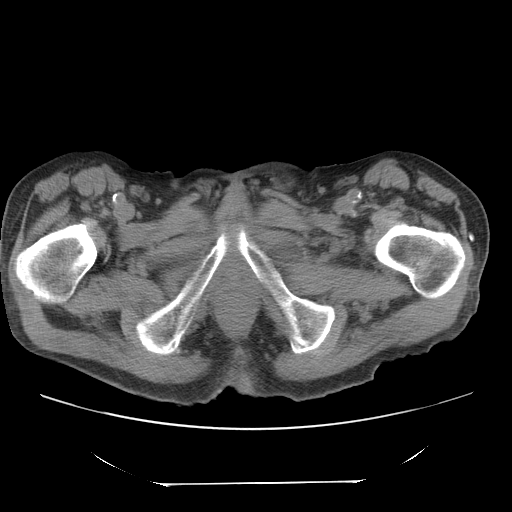
[im 4/90  bone]
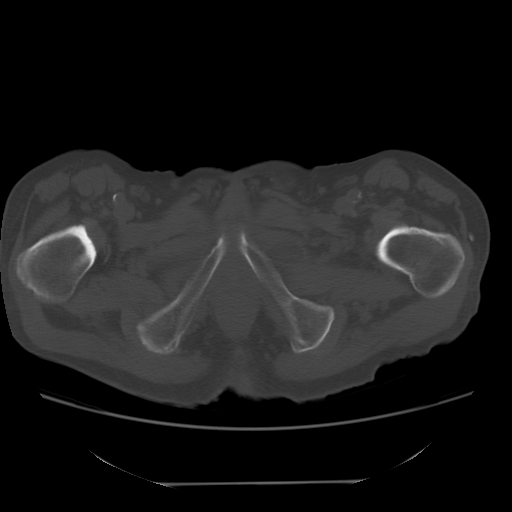
[im 11/90  soft-tissue]
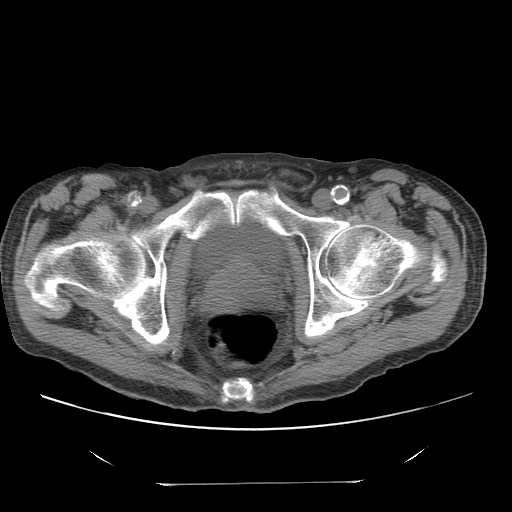
[im 18/90  soft-tissue]
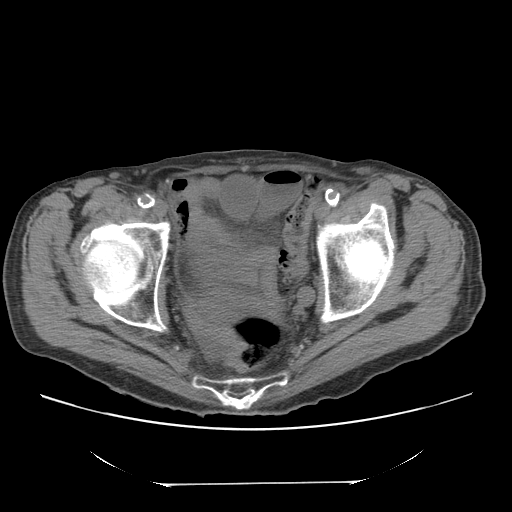
[im 25/90  soft-tissue]
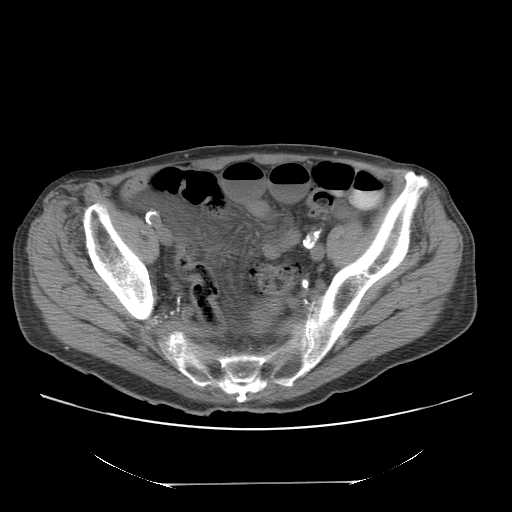
[im 33/90  soft-tissue]
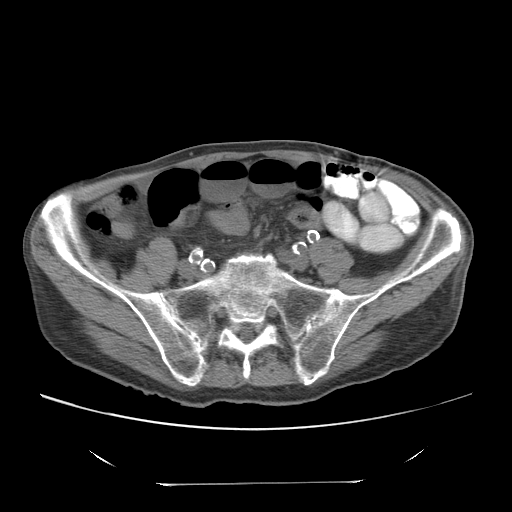
[im 40/90  soft-tissue]
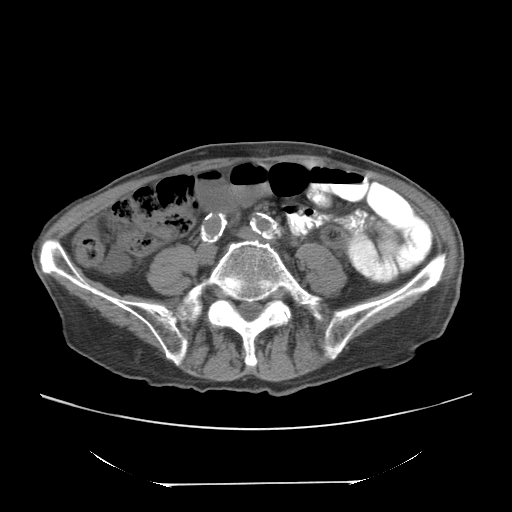
[im 47/90  soft-tissue]
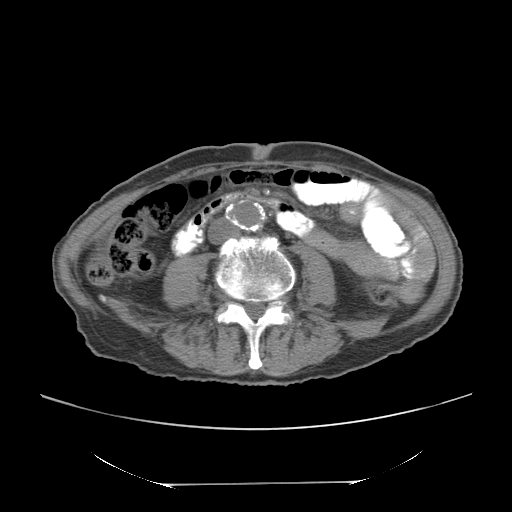
[im 50/90  soft-tissue]
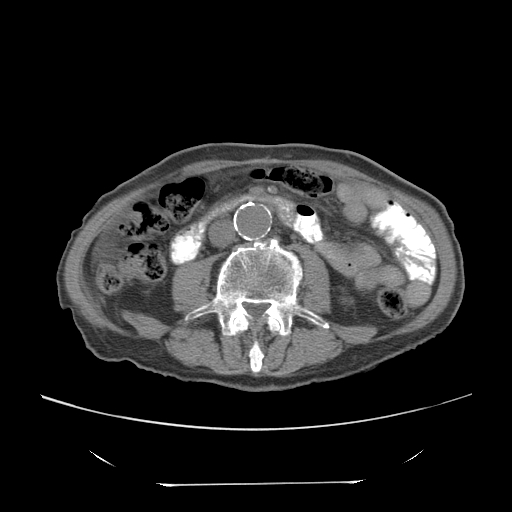
[im 57/90  soft-tissue]
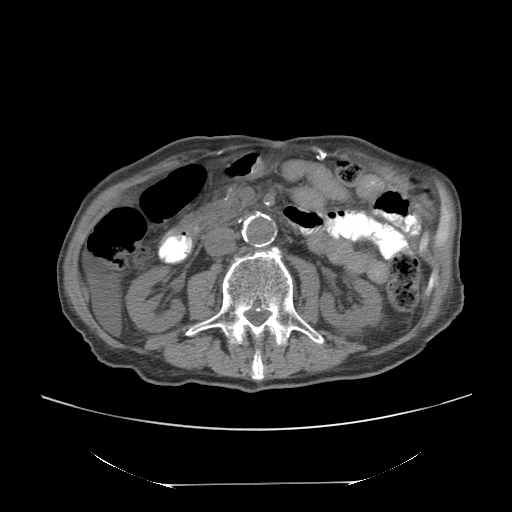
[im 57/90  bone]
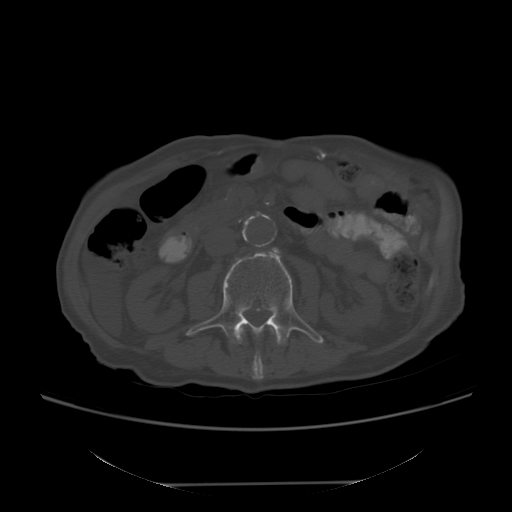
[im 65/90  soft-tissue]
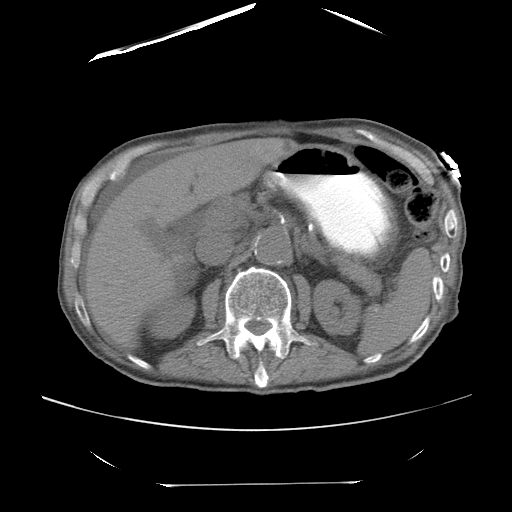
[im 72/90  soft-tissue]
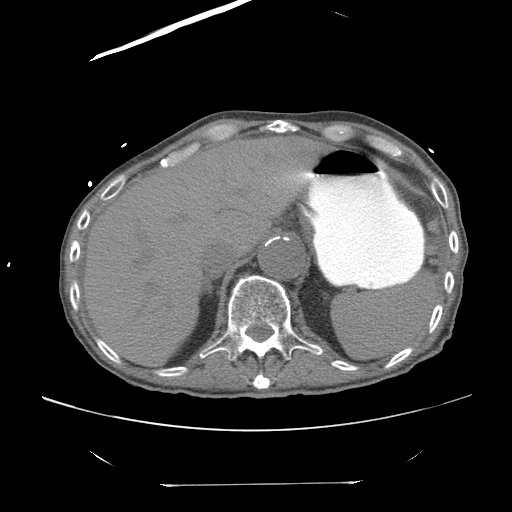
[im 79/90  soft-tissue]
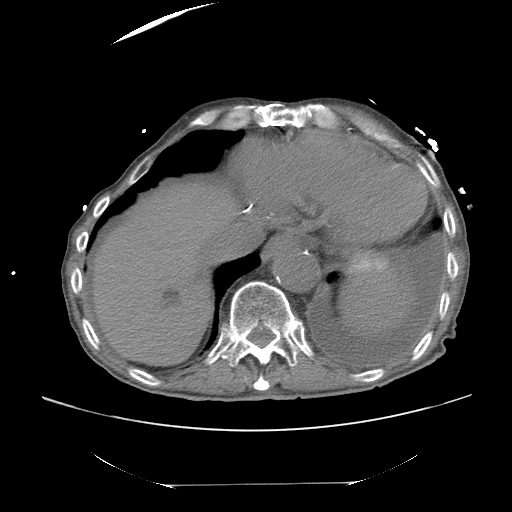
[im 86/90  soft-tissue]
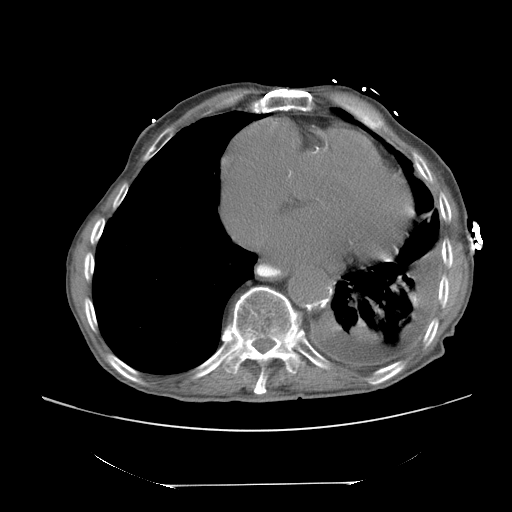

[Series 401: cor · coronal · 0.89mm/px · 3 of 79 slices shown]
[im 27/79  soft-tissue]
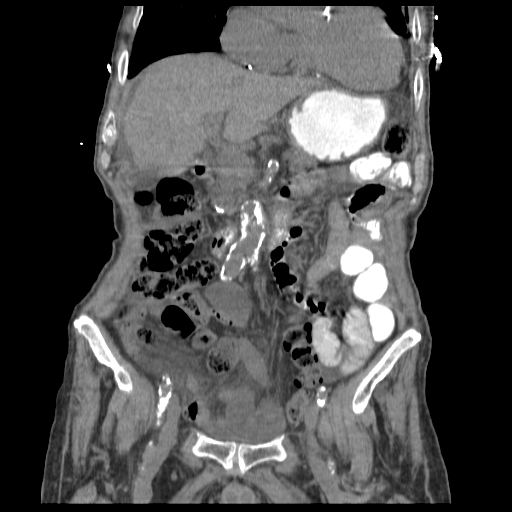
[im 35/79  soft-tissue]
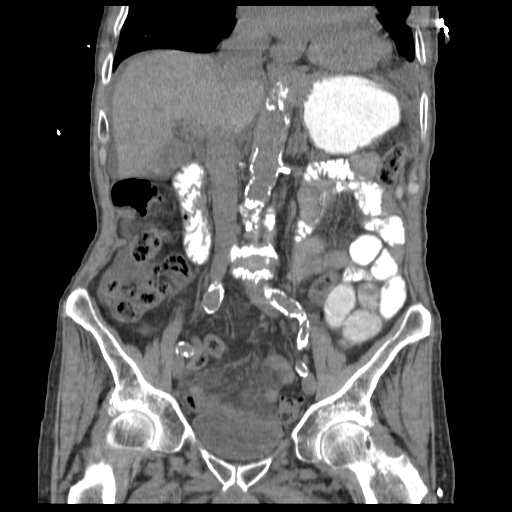
[im 44/79  soft-tissue]
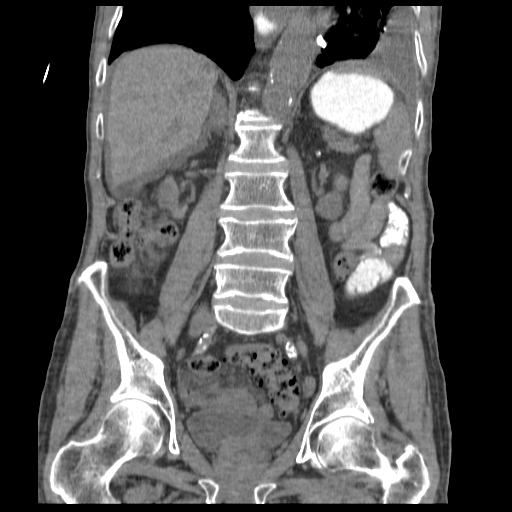

[16 of 46 positions shown; findings below may reference images not displayed]

FINDINGS: There is pleural fluid on the left, moderate in amount,
with atelectasis in the left lower lobe.  No pleural fluid seen on
the right.  There is cardiomegaly.

The liver shows a 2.2 cm low density lesion at the dome.  This
appears well circumscribed and is quite low density.  The likely
diagnosis is cyst or hemangioma, but that cannot be characterized
precisely.  There are multiple small gallstones in the gallbladder
layering dependently.  I do not see biliary ductal dilatation.  I
do not see a definable stone in the common duct.

The spleen is normal.  The pancreas is normal.  No adrenal mass.
The right kidney is small but does not show any focal lesion or
hydronephrosis.  The left kidney is small and contains a rounded
low density 2.8 cm lesion in the midportion likely represent a
cyst.  This was present on ultrasound [DATE].

There is pronounced atherosclerosis of the aorta and its branch
vessels.  There is an infrarenal abdominal aortic aneurysm without
evidence of rupture.  Maximal diameter is 3.1 cm.  The IVC is
normal.  No retroperitoneal mass or adenopathy.

There is some ascites collected freely around the liver and
extending into the right pericolic gutter.  In the pelvis, there is
a small amount of free fluid.

There are abnormal dilated fluid and air filled loops of small
bowel carefully notable in the pelvis.  I think the distal small
bowel is not dilated.  The the exact cause of the obstruction and
is not clear.  There is a minimal inguinal hernia.  Small bowel
extends to the mouth of this but does not appear to and aerated or
become grossly incarcerated. On the left, some fluid enters the
hernia but I cannot establish that a small intestine does.

There is diverticulosis of the colon but no definable
diverticulitis.

No significant osseous finding.
IMPRESSION: Probable distal small bowel obstruction, etiology indeterminate.
The patient has small inguinal hernias.  On the right, small
intestine does approached the mouth of this hernia but does not
grossly anterior.  On the left, it appears that there is some fluid
in the hernia but I cannot establish that small intestine enters
the hernia.

Freely distributed ascites probably related to a small bowel
obstruction.

Left pleural effusion with atelectasis in the left lower lobe.

2.2 cm low density abnormality at the dome of the liver likely
represent a cyst or hemangioma.  This is not fully characterized
however.

Small gallstones without imaging evidence of cholecystitis or
obstruction.

3.1 cm infrarenal abdominal aortic aneurysm.

## 2012-02-21 ENCOUNTER — Telehealth: Payer: Self-pay | Admitting: Cardiology

## 2012-02-21 MED ORDER — LORAZEPAM 0.5 MG PO TABS: 0.5000 mg | ORAL_TABLET | Freq: Three times a day (TID) | ORAL | Status: DC | PRN

## 2012-02-21 NOTE — Telephone Encounter (Signed)
New problem   Wife calling to speak with nurse. Will disclose when she calls back

## 2012-02-21 NOTE — Telephone Encounter (Signed)
Spoke with patient's wife she stated patient needed refill on lorazepam.Lorazepam refilled.

## 2012-03-04 ENCOUNTER — Telehealth: Payer: Self-pay | Admitting: *Deleted

## 2012-03-04 NOTE — Telephone Encounter (Signed)
Pharmacy needs  Clarification on Lorazepam [id number]

## 2012-03-05 NOTE — Telephone Encounter (Signed)
Spoke with pharmacist at [facility] he wanted to verify 02/21/12 lorazepam prescription.Lorazepam 0.5 mg every 8 hrs as needed for anxiety # 60 refill x 1.

## 2012-03-27 ENCOUNTER — Encounter: Payer: Self-pay | Admitting: Cardiology

## 2012-03-27 ENCOUNTER — Ambulatory Visit (INDEPENDENT_AMBULATORY_CARE_PROVIDER_SITE_OTHER): Payer: Medicare Other | Admitting: Cardiology

## 2012-03-27 VITALS — BP 106/58 | HR 74 | Ht 74.0 in | Wt 146.8 lb

## 2012-03-27 DIAGNOSIS — E7800 Pure hypercholesterolemia, unspecified: Secondary | ICD-10-CM

## 2012-03-27 DIAGNOSIS — D509 Iron deficiency anemia, unspecified: Secondary | ICD-10-CM

## 2012-03-27 DIAGNOSIS — N184 Chronic kidney disease, stage 4 (severe): Secondary | ICD-10-CM

## 2012-03-27 DIAGNOSIS — I10 Essential (primary) hypertension: Secondary | ICD-10-CM

## 2012-03-27 DIAGNOSIS — I482 Chronic atrial fibrillation, unspecified: Secondary | ICD-10-CM

## 2012-03-27 DIAGNOSIS — I509 Heart failure, unspecified: Secondary | ICD-10-CM

## 2012-03-27 DIAGNOSIS — I4891 Unspecified atrial fibrillation: Secondary | ICD-10-CM

## 2012-03-27 DIAGNOSIS — I2510 Atherosclerotic heart disease of native coronary artery without angina pectoris: Secondary | ICD-10-CM

## 2012-03-27 LAB — CBC WITH DIFFERENTIAL/PLATELET
Basophils Absolute: 0 10*3/uL (ref 0.0–0.1)
Basophils Relative: 0.4 % (ref 0.0–3.0)
Eosinophils Relative: 4.4 % (ref 0.0–5.0)
HCT: 35.5 % — ABNORMAL LOW (ref 39.0–52.0)
Hemoglobin: 11.6 g/dL — ABNORMAL LOW (ref 13.0–17.0)
Lymphocytes Relative: 17.2 % (ref 12.0–46.0)
Lymphs Abs: 1.1 10*3/uL (ref 0.7–4.0)
Monocytes Relative: 8.1 % (ref 3.0–12.0)
Neutro Abs: 4.3 10*3/uL (ref 1.4–7.7)
RBC: 3.6 Mil/uL — ABNORMAL LOW (ref 4.22–5.81)
WBC: 6.2 10*3/uL (ref 4.5–10.5)

## 2012-03-27 LAB — BASIC METABOLIC PANEL
Chloride: 101 mEq/L (ref 96–112)
GFR: 28.83 mL/min — ABNORMAL LOW (ref >60.00)
Potassium: 4.5 mEq/L (ref 3.5–5.1)
Sodium: 139 mEq/L (ref 135–145)

## 2012-03-27 LAB — LIPID PANEL
Cholesterol: 144 mg/dL (ref 0–200)
HDL: 44.6 mg/dL (ref >39.00)
LDL Cholesterol: 84 mg/dL (ref 0–99)
VLDL: 15.6 mg/dL (ref 0.0–40.0)

## 2012-03-27 LAB — HEPATIC FUNCTION PANEL
Albumin: 3.3 g/dL — ABNORMAL LOW (ref 3.5–5.2)
Total Protein: 7.2 g/dL (ref 6.0–8.3)

## 2012-03-27 MED ORDER — FUROSEMIDE 40 MG PO TABS: 20.0000 mg | ORAL_TABLET | Freq: Every day | ORAL | Status: DC

## 2012-03-27 MED ORDER — LORAZEPAM 0.5 MG PO TABS: 0.5000 mg | ORAL_TABLET | Freq: Three times a day (TID) | ORAL | Status: DC | PRN

## 2012-03-27 MED ORDER — ATENOLOL 50 MG PO TABS: 50.0000 mg | ORAL_TABLET | Freq: Every day | ORAL | Status: DC

## 2012-03-27 MED ORDER — NITROGLYCERIN 0.4 MG SL SUBL: 0.4000 mg | SUBLINGUAL_TABLET | SUBLINGUAL | Status: DC | PRN

## 2012-03-27 MED ORDER — POLYETHYLENE GLYCOL 3350 17 GM/SCOOP PO POWD: ORAL | Status: DC

## 2012-03-27 NOTE — Patient Instructions (Signed)
Continue your current medication.  I will see you in 6 months.  We will check blood work today and call with the results.

## 2012-03-27 NOTE — Addendum Note (Signed)
Addended by: Tonita Phoenix on: 03/27/2012 10:18 AM   Modules accepted: Orders

## 2012-03-27 NOTE — Progress Notes (Signed)
Blake Arias Date of Birth: 1924/10/13 Medical Record #[id number]  History of Present Illness: Blake Arias is seen today for a followup visit. He has multiple medical issues which include CAD, diastolic dysfunction, chronic atrial fib, chronic renal failure and advanced age. He has a history of anemia requiring transfusion. Coumadin was discontinued because of increased risk. He has refused to see GI. For followup today he reports he is doing very well. He denies any symptoms of chest pain, dyspnea, palpitations, or dizziness. He's had no increase in edema. His wife reports that his appetite is good and that he is eating no fat.  Current Outpatient Prescriptions on File Prior to Visit  Medication Sig Dispense Refill  . ferrous sulfate 325 (65 FE) MG tablet Take 325 mg by mouth 2 (two) times daily.      . Multiple Vitamins-Minerals (CENTRUM SILVER PO) Take by mouth.         No current facility-administered medications on file prior to visit.    No Known Allergies  Past Medical History  Diagnosis Date  . CHF (congestive heart failure)     secondary to diastolic dysfunction; last echo in 2009. Normal EF.   Marland Kitchen History of diastolic dysfunction   . Coronary artery disease     s/p CABG in 2001 with LIMA to OM, SVG to DX, SVG to prox & distal PD  . CKD (chronic kidney disease) stage 4, GFR 15-29 ml/min   . Hypertension   . Pulmonary hypertension   . Hyperlipidemia   . Chronic atrial fibrillation   . SBO (small bowel obstruction)     PARTIAL  . Chronic anticoagulation     stopped coumadin due to profound anemia 05/02/2011  . Anemia     Refuses GI workup  . Weight loss     Past Surgical History  Procedure Laterality Date  . Cardiac catheterization  03/31/1999    EF 70%  . Cholecystectomy    . External ear surgery    . Coronary artery bypass graft  2001    LIMA GRAFT TO THE OBTUSE MARGINAL VESSEL, SAPHENOUS VEIN GRAFT TO THE DIAGONAL , SAPHENOUS VEIN GRAFT TO THE PROXIMAL AND DISTAL  PDA, AND SAPHENOUS VEIN GRAFT TO THE POSTERIOR LATERAL BRANCH THE RIGHT CORONARY  . Transthoracic echocardiogram  12/17/2007    EF 55-60%  . US echocardiography  05/31/2007    EF 55-60%  . US echocardiography  09/21/2004    EF 55-60%  . Cardiovascular stress test  09/22/2004    EF 53%    History  Smoking status  . Never Smoker   Smokeless tobacco  . Not on file    History  Alcohol Use No    Family History  Problem Relation Age of Onset  . Heart attack Mother   . Heart attack Brother   . Hypertension Brother   . Hypertension Sister     Review of Systems: The review of systems is per the HPI. He has lost 6 pounds since his last visit. All other systems were reviewed and are negative.  Physical Exam: BP 106/58  Pulse 74  Ht 6\' 2"  (1.88 m)  Wt 146 lb 12.8 oz (66.588 kg)  BMI 18.84 kg/m2 Patient is very pleasant elderly male in no acute distress.  Skin is warm and dry. HEENT is unremarkable but has multiple of missing teeth. Normocephalic/atraumatic. PERRL. Sclera are nonicteric. Neck is supple. No masses. No JVD. Lungs are clear. Cardiac exam shows an irregular rhythm. Rate is controlled.  There is a grade 2/6 systolic murmur heard best at the  apex. Abdomen is soft, nontender. Bowel sounds are positive. No masses or bruits. Extremities are without edema. Gait and ROM are intact. No gross neurologic deficits noted.   LABORATORY DATA:  ECG demonstrates atrial fibrillation with a rate of 76 beats per minute. He has a left anterior fascicular block and right bundle branch block. There T wave changes consistent with lateral ischemia. Compared to prior ECG of February 2013 there is no change.  Assessment / Plan: 1. Congestive heart failure with chronic diastolic dysfunction. He appears to be well compensated today. We will continue with Lasix 20 mg daily. Continue sodium restriction. He is not a candidate for ACE inhibitor or ARB due to chronic kidney disease.  2. Atrial  fibrillation, permanent. Rate is well controlled on atenolol. He is not a candidate for anticoagulation given his profound anemia requiring transfusion. He has refused GI evaluation.  3. Chronic kidney disease stage IV.  4. Coronary disease status post CABG in 2001. Denies significant anginal symptoms.  5. Chronic anemia. Patient has refused GI workup. Continue iron supplementation. At least his weight has stabilized.  We will check complete lab work today including chemistries, lipid panel, CBC, and TSH. I'll followup again in 6 months.

## 2012-04-01 ENCOUNTER — Telehealth: Payer: Self-pay

## 2012-04-01 DIAGNOSIS — I2510 Atherosclerotic heart disease of native coronary artery without angina pectoris: Secondary | ICD-10-CM

## 2012-04-01 MED ORDER — LORAZEPAM 0.5 MG PO TABS: 0.5000 mg | ORAL_TABLET | Freq: Three times a day (TID) | ORAL | Status: DC | PRN

## 2012-04-01 NOTE — Telephone Encounter (Signed)
Received fax refill request for lorazepam.Refill phoned in to [facility] [address].

## 2012-04-02 ENCOUNTER — Telehealth: Payer: Self-pay | Admitting: Cardiology

## 2012-04-02 MED ORDER — LORAZEPAM 0.5 MG PO TABS: ORAL_TABLET | ORAL | Status: DC

## 2012-04-02 NOTE — Telephone Encounter (Signed)
New problem   Lorazepam 0.5 mg .    [facility] [address]

## 2012-04-02 NOTE — Telephone Encounter (Signed)
Spoke to patient's wife she stated patient takes lorazepam 0.5 mg three times a day.Wife was told will change directions.Spoke to pharmacist at [facility] correct directions given.

## 2012-07-29 ENCOUNTER — Telehealth: Payer: Self-pay | Admitting: Cardiology

## 2012-07-29 MED ORDER — LORAZEPAM 0.5 MG PO TABS: ORAL_TABLET | ORAL | Status: DC

## 2012-07-29 NOTE — Telephone Encounter (Deleted)
error 

## 2012-07-29 NOTE — Telephone Encounter (Signed)
Error

## 2012-08-08 ENCOUNTER — Telehealth: Payer: Self-pay | Admitting: Cardiology

## 2012-08-09 ENCOUNTER — Telehealth: Payer: Self-pay

## 2012-08-09 MED ORDER — LORAZEPAM 0.5 MG PO TABS: ORAL_TABLET | ORAL | Status: DC

## 2012-08-09 NOTE — Telephone Encounter (Signed)
Received refill request from [facility] for lorazepam 0.5 mg.Prescription phoned into to pharmacy.

## 2012-10-09 ENCOUNTER — Ambulatory Visit (INDEPENDENT_AMBULATORY_CARE_PROVIDER_SITE_OTHER): Payer: Medicare Other | Admitting: Cardiology

## 2012-10-09 ENCOUNTER — Encounter: Payer: Self-pay | Admitting: Cardiology

## 2012-10-09 VITALS — BP 92/55 | HR 83 | Ht 70.0 in | Wt 138.0 lb

## 2012-10-09 DIAGNOSIS — I2510 Atherosclerotic heart disease of native coronary artery without angina pectoris: Secondary | ICD-10-CM

## 2012-10-09 DIAGNOSIS — N184 Chronic kidney disease, stage 4 (severe): Secondary | ICD-10-CM

## 2012-10-09 DIAGNOSIS — I509 Heart failure, unspecified: Secondary | ICD-10-CM

## 2012-10-09 DIAGNOSIS — I482 Chronic atrial fibrillation, unspecified: Secondary | ICD-10-CM

## 2012-10-09 DIAGNOSIS — I4891 Unspecified atrial fibrillation: Secondary | ICD-10-CM

## 2012-10-09 MED ORDER — FUROSEMIDE 40 MG PO TABS: 20.0000 mg | ORAL_TABLET | Freq: Every day | ORAL | Status: DC | PRN

## 2012-10-09 NOTE — Patient Instructions (Signed)
Take furosemide as needed only for increased swelling or SOB. He does not need to take this daily.  Continue your other therapy  I will see you in 6 months.

## 2012-10-09 NOTE — Progress Notes (Signed)
Blake Arias Date of Birth: 08/25/1924 Medical Record #[id number]  History of Present Illness: Blake Arias is seen today for a followup visit. He has multiple medical issues which include CAD, diastolic dysfunction, chronic atrial fib, chronic renal failure and advanced age. He has a history of anemia requiring transfusion. Coumadin was discontinued because of increased risk. He has refused to see GI. He is seen today with his wife to ask as interpreter. She states she is doing very well. He does complain of feeling lightheaded at times. She states she is also not eating as well and has lost 8 pounds. He denies any chest pain, palpitations, or shortness of breath. She states that his walking has slowed down.   Current Outpatient Prescriptions on File Prior to Visit  Medication Sig Dispense Refill  . atenolol (TENORMIN) 50 MG tablet Take 1 tablet (50 mg total) by mouth daily.  30 tablet  11  . ferrous sulfate 325 (65 FE) MG tablet Take 325 mg by mouth 2 (two) times daily.      Marland Kitchen LORazepam (ATIVAN) 0.5 MG tablet Take 0.5 mg three times a day  90 tablet  3  . Multiple Vitamins-Minerals (CENTRUM SILVER PO) Take by mouth.        . nitroGLYCERIN (NITROSTAT) 0.4 MG SL tablet Place 1 tablet (0.4 mg total) under the tongue every 5 (five) minutes as needed.  25 tablet  11  . polyethylene glycol powder (GLYCOLAX/MIRALAX) powder Take 1 Capful in 4 to 8oz of water or juice daily  119 g  5   No current facility-administered medications on file prior to visit.    No Known Allergies  Past Medical History  Diagnosis Date  . CHF (congestive heart failure)     secondary to diastolic dysfunction; last echo in 2009. Normal EF.   Marland Kitchen History of diastolic dysfunction   . Coronary artery disease     s/p CABG in 2001 with LIMA to OM, SVG to DX, SVG to prox & distal PD  . CKD (chronic kidney disease) stage 4, GFR 15-29 ml/min   . Hypertension   . Pulmonary hypertension   . Hyperlipidemia   . Chronic atrial  fibrillation   . SBO (small bowel obstruction)     PARTIAL  . Chronic anticoagulation     stopped coumadin due to profound anemia 05/02/2011  . Anemia     Refuses GI workup  . Weight loss     Past Surgical History  Procedure Laterality Date  . Cardiac catheterization  03/31/1999    EF 70%  . Cholecystectomy    . External ear surgery    . Coronary artery bypass graft  2001    LIMA GRAFT TO THE OBTUSE MARGINAL VESSEL, SAPHENOUS VEIN GRAFT TO THE DIAGONAL , SAPHENOUS VEIN GRAFT TO THE PROXIMAL AND DISTAL PDA, AND SAPHENOUS VEIN GRAFT TO THE POSTERIOR LATERAL BRANCH THE RIGHT CORONARY  . Transthoracic echocardiogram  12/17/2007    EF 55-60%  . US echocardiography  05/31/2007    EF 55-60%  . US echocardiography  09/21/2004    EF 55-60%  . Cardiovascular stress test  09/22/2004    EF 53%    History  Smoking status  . Never Smoker   Smokeless tobacco  . Not on file    History  Alcohol Use No    Family History  Problem Relation Age of Onset  . Heart attack Mother   . Heart attack Brother   . Hypertension Brother   .  Hypertension Sister     Review of Systems: The review of systems is per the HPI.  All other systems were reviewed and are negative.  Physical Exam: BP 92/55  Pulse 83  Ht 5\' 10"  (1.778 m)  Wt 138 lb (62.596 kg)  BMI 19.8 kg/m2 Patient is very pleasant elderly male in no acute distress.  Skin is warm and dry. HEENT is unremarkable but has multiple of missing teeth. Normocephalic/atraumatic. PERRL. Sclera are nonicteric. Neck is supple. No masses. No JVD. Lungs are clear. Cardiac exam shows an irregular rhythm. Rate is controlled. There is a grade 2/6 systolic murmur heard best at the  apex. Abdomen is soft, nontender. Bowel sounds are positive. No masses or bruits. Extremities are without edema. Gait and ROM are intact. No gross neurologic deficits noted.   LABORATORY DATA:  Lab Results  Component Value Date   WBC 6.2 03/27/2012   HGB 11.6* 03/27/2012   HCT  35.5* 03/27/2012   PLT 169.0 03/27/2012   GLUCOSE 95 03/27/2012   CHOL 144 03/27/2012   TRIG 78.0 03/27/2012   HDL 44.60 03/27/2012   LDLCALC 84 03/27/2012   ALT 19 03/27/2012   AST 27 03/27/2012   NA 139 03/27/2012   K 4.5 03/27/2012   CL 101 03/27/2012   CREATININE 2.3* 03/27/2012   BUN 42* 03/27/2012   CO2 30 03/27/2012   TSH 2.60 03/27/2012   PSA 0.64 01/31/2007   INR 3.01* 04/26/2011   HGBA1C  Value: 5.5 (NOTE)   The ADA recommends the following therapeutic goal for glycemic   control related to Hgb A1C measurement:   Goal of Therapy:   < 7.0% Hgb A1C   Reference: American Diabetes Association: Clinical Practice   Recommendations 2008, Diabetes Care,  2008, 31:(Suppl 1). 12/17/2007     Assessment / Plan: 1. Congestive heart failure with chronic diastolic dysfunction. He appears to be well compensated today. Given his significant weight loss I have recommended stopping his Lasix and using this only as needed for increased edema. Continue sodium restriction. He is not a candidate for ACE inhibitor or ARB due to chronic kidney disease.  2. Atrial fibrillation, permanent. Rate is well controlled on atenolol. He is not a candidate for anticoagulation given his profound anemia requiring transfusion. He has refused GI evaluation.  3. Chronic kidney disease stage IV.  4. Coronary disease status post CABG in 2001. Denies significant anginal symptoms.  5. Chronic anemia. Patient has refused GI workup. Continue iron supplementation.

## 2012-12-30 ENCOUNTER — Telehealth: Payer: Self-pay | Admitting: Cardiology

## 2012-12-30 ENCOUNTER — Other Ambulatory Visit: Payer: Self-pay | Admitting: *Deleted

## 2012-12-30 MED ORDER — LORAZEPAM 0.5 MG PO TABS: ORAL_TABLET | ORAL | Status: DC

## 2012-12-30 NOTE — Telephone Encounter (Deleted)
Error//SR ° ° ° ° °

## 2013-04-07 ENCOUNTER — Other Ambulatory Visit: Payer: Self-pay

## 2013-04-07 ENCOUNTER — Ambulatory Visit (INDEPENDENT_AMBULATORY_CARE_PROVIDER_SITE_OTHER): Payer: Medicare Other | Admitting: Cardiology

## 2013-04-07 ENCOUNTER — Encounter: Payer: Self-pay | Admitting: Cardiology

## 2013-04-07 VITALS — BP 108/60 | HR 76 | Ht 70.0 in | Wt 134.0 lb

## 2013-04-07 DIAGNOSIS — N184 Chronic kidney disease, stage 4 (severe): Secondary | ICD-10-CM

## 2013-04-07 DIAGNOSIS — I2510 Atherosclerotic heart disease of native coronary artery without angina pectoris: Secondary | ICD-10-CM

## 2013-04-07 DIAGNOSIS — I5032 Chronic diastolic (congestive) heart failure: Secondary | ICD-10-CM

## 2013-04-07 DIAGNOSIS — N189 Chronic kidney disease, unspecified: Secondary | ICD-10-CM

## 2013-04-07 DIAGNOSIS — I509 Heart failure, unspecified: Secondary | ICD-10-CM

## 2013-04-07 DIAGNOSIS — I4891 Unspecified atrial fibrillation: Secondary | ICD-10-CM

## 2013-04-07 DIAGNOSIS — I482 Chronic atrial fibrillation, unspecified: Secondary | ICD-10-CM

## 2013-04-07 MED ORDER — NITROGLYCERIN 0.4 MG SL SUBL: 0.4000 mg | SUBLINGUAL_TABLET | SUBLINGUAL | Status: DC | PRN

## 2013-04-07 MED ORDER — POLYETHYLENE GLYCOL 3350 17 GM/SCOOP PO POWD: ORAL | Status: DC

## 2013-04-07 MED ORDER — FUROSEMIDE 40 MG PO TABS: 20.0000 mg | ORAL_TABLET | Freq: Every day | ORAL | Status: DC | PRN

## 2013-04-07 MED ORDER — FERROUS SULFATE 325 (65 FE) MG PO TABS: 325.0000 mg | ORAL_TABLET | Freq: Every day | ORAL | Status: DC

## 2013-04-07 MED ORDER — ATENOLOL 50 MG PO TABS: 50.0000 mg | ORAL_TABLET | Freq: Every day | ORAL | Status: DC

## 2013-04-07 NOTE — Patient Instructions (Signed)
Continue your current therapy  Try and supplement his diet with Boost or Ensure supplement daily.  I will see in 6 months.

## 2013-04-07 NOTE — Progress Notes (Signed)
[place] Underhill Date of Birth: 13-Jun-1926d Medical Record #[id number]  History of Present Illness: [name] is seen today for a followup visit. He has multiple medical issues which include CAD, diastolic dysfunction, chronic atrial fib, chronic renal failure and advanced age. He has a history of anemia requiring transfusion. Coumadin was discontinued because of increased risk. He has refused to see GI. He is seen today with his wife to ask as interpreter. She states she is doing very well. No chest pain, dyspnea, or palpitations. Takes lasix about every third day. She states she is also not eating as well and continue to lose weight. He refuses any lab work.   Current Outpatient Prescriptions on File Prior to Visit  Medication Sig Dispense Refill  . atenolol (TENORMIN) 50 MG tablet Take 1 tablet (50 mg total) by mouth daily.  30 tablet  11  . furosemide (LASIX) 40 MG tablet Take 0.5 tablets (20 mg total) by mouth daily as needed for edema.  30 tablet  11  . LORazepam (ATIVAN) 0.5 MG tablet Take 0.5 mg three times a day  90 tablet  3  . Multiple Vitamins-Minerals (CENTRUM SILVER PO) Take by mouth.        . nitroGLYCERIN (NITROSTAT) 0.4 MG SL tablet Place 1 tablet (0.4 mg total) under the tongue every 5 (five) minutes as needed.  25 tablet  11  . polyethylene glycol powder (GLYCOLAX/MIRALAX) powder Take 1 Capful in 4 to 8oz of water or juice daily  119 g  5   No current facility-administered medications on file prior to visit.    No Known Allergies  Past Medical History  Diagnosis Date  . CHF (congestive heart failure)     secondary to diastolic dysfunction; last echo in 2009. Normal EF.   Marland Kitchen. History of diastolic dysfunction   . Coronary artery disease     s/p CABG in 2001 with LIMA to OM, SVG to DX, SVG to prox & distal PD  . CKD (chronic kidney disease) stage 4, GFR 15-29 ml/min   . Hypertension   . Pulmonary hypertension   . Hyperlipidemia   . Chronic atrial fibrillation   . SBO  (small bowel obstruction)     PARTIAL  . Chronic anticoagulation     stopped coumadin due to profound anemia 05/02/2011  . Anemia     Refuses GI workup  . Weight loss     Past Surgical History  Procedure Laterality Date  . Cardiac catheterization  03/31/1999    EF 70%  . Cholecystectomy    . External ear surgery    . Coronary artery bypass graft  2001    LIMA GRAFT TO THE OBTUSE MARGINAL VESSEL, SAPHENOUS VEIN GRAFT TO THE DIAGONAL , SAPHENOUS VEIN GRAFT TO THE PROXIMAL AND DISTAL PDA, AND SAPHENOUS VEIN GRAFT TO THE POSTERIOR LATERAL BRANCH THE RIGHT CORONARY  . Transthoracic echocardiogram  12/17/2007    EF 55-60%  . [place] echocardiography  05/31/2007    EF 55-60%  . [place] echocardiography  09/21/2004    EF 55-60%  . Cardiovascular stress test  09/22/2004    EF 53%    History  Smoking status  . Never Smoker   Smokeless tobacco  . Not on file    History  Alcohol Use No    Family History  Problem Relation Age of Onset  . Heart attack Mother   . Heart attack Brother   . Hypertension Brother   . Hypertension Sister  Review of Systems: The review of systems is per the HPI.  All other systems were reviewed and are negative.  Physical Exam: BP 108/60  Pulse 76  Ht 5\' 10"  (1.778 m)  Wt 134 lb (60.782 kg)  BMI 19.23 kg/m2 Patient is very pleasant elderly male in no acute distress.  Skin is warm and dry. HEENT is unremarkable but has multiple of missing teeth. Normocephalic/atraumatic. PERRL. Sclera are nonicteric. Neck is supple. No masses. No JVD. Lungs are clear. Cardiac exam shows an irregular rhythm. Rate is controlled. There is a grade 2/6 systolic murmur heard best at the  apex. Abdomen is soft, nontender. Bowel sounds are positive. No masses or bruits. Extremities are without edema. Gait and ROM are intact. No gross neurologic deficits noted.   LABORATORY DATA:  Lab Results  Component Value Date   WBC 6.2 03/27/2012   HGB 11.6* 03/27/2012   HCT 35.5* 03/27/2012    PLT 169.0 03/27/2012   GLUCOSE 95 03/27/2012   CHOL 144 03/27/2012   TRIG 78.0 03/27/2012   HDL 44.60 03/27/2012   LDLCALC 84 03/27/2012   ALT 19 03/27/2012   AST 27 03/27/2012   NA 139 03/27/2012   K 4.5 03/27/2012   CL 101 03/27/2012   CREATININE 2.3* 03/27/2012   BUN 42* 03/27/2012   CO2 30 03/27/2012   TSH 2.60 03/27/2012   PSA 0.64 01/31/2007   INR 3.01* 04/26/2011   HGBA1C  Value: 5.5 (NOTE)   The ADA recommends the following therapeutic goal for glycemic   control related to Hgb A1C measurement:   Goal of Therapy:   < 7.0% Hgb A1C   Reference: American Diabetes Association: Clinical Practice   Recommendations 2008, Diabetes Care,  2008, 31:(Suppl 1). 12/17/2007   Ecg: atrial fibrillation with rate 76 bpm, LAFB, RBBB, LVH with repolarization abnormality.  Assessment / Plan: 1. Congestive heart failure with chronic diastolic dysfunction. He appears to be well compensated today. Will continue using lasix only as needed for increased edema. Continue sodium restriction. He is not a candidate for ACE inhibitor or ARB due to chronic kidney disease.  2. Atrial fibrillation, permanent. Rate is well controlled on atenolol. He is not a candidate for anticoagulation given his profound anemia requiring transfusion. He has refused GI evaluation.  3. Chronic kidney disease stage IV.  4. Coronary disease status post CABG in 2001. Denies significant anginal symptoms.  5. Chronic anemia. Patient has refused GI workup. Continue iron supplementation.   Patient refuses any follow up lab work. I have encouraged use of Boost or Ensure to supplement his diet. Will refill medications. I will follow up in 6 months.

## 2013-05-01 NOTE — Telephone Encounter (Signed)
errro

## 2013-10-10 ENCOUNTER — Encounter: Payer: Self-pay | Admitting: Cardiology

## 2013-10-10 ENCOUNTER — Ambulatory Visit (INDEPENDENT_AMBULATORY_CARE_PROVIDER_SITE_OTHER): Payer: Medicare Other | Admitting: Cardiology

## 2013-10-10 VITALS — BP 96/60 | HR 74 | Ht 70.0 in | Wt 138.4 lb

## 2013-10-10 DIAGNOSIS — I482 Chronic atrial fibrillation, unspecified: Secondary | ICD-10-CM

## 2013-10-10 DIAGNOSIS — I5032 Chronic diastolic (congestive) heart failure: Secondary | ICD-10-CM

## 2013-10-10 DIAGNOSIS — I2510 Atherosclerotic heart disease of native coronary artery without angina pectoris: Secondary | ICD-10-CM

## 2013-10-10 MED ORDER — LORAZEPAM 0.5 MG PO TABS: ORAL_TABLET | ORAL | Status: DC

## 2013-10-10 MED ORDER — FUROSEMIDE 40 MG PO TABS: 20.0000 mg | ORAL_TABLET | Freq: Every day | ORAL | Status: DC | PRN

## 2013-10-10 MED ORDER — ATENOLOL 50 MG PO TABS: 50.0000 mg | ORAL_TABLET | Freq: Every day | ORAL | Status: DC

## 2013-10-10 MED ORDER — POLYETHYLENE GLYCOL 3350 17 GM/SCOOP PO POWD: ORAL | Status: DC

## 2013-10-10 NOTE — Patient Instructions (Signed)
Continue your current therapy  I will see you in 6 months.   

## 2013-10-10 NOTE — Progress Notes (Signed)
Cap Gowell Date of Birth: Jul 08, 1926d Medical Record #[id number]  History of Present Illness: [name] is seen today for a followup visit. He has multiple medical issues which include CAD, diastolic dysfunction, chronic atrial fib, chronic renal failure and advanced age. He has a history of anemia requiring transfusion. Coumadin was discontinued because of increased risk. He has refused to see GI. He is seen today with his wife and an interpreter. She states he is doing  well. No chest pain, dyspnea, or palpitations. Takes lasix about prn. She states he can no longer hear. He refuses any lab work.   Current Outpatient Prescriptions on File Prior to Visit  Medication Sig Dispense Refill  . ferrous sulfate 325 (65 FE) MG tablet Take 1 tablet (325 mg total) by mouth daily with breakfast.      . Multiple Vitamins-Minerals (CENTRUM SILVER PO) Take by mouth.        . nitroGLYCERIN (NITROSTAT) 0.4 MG SL tablet Place 1 tablet (0.4 mg total) under the tongue every 5 (five) minutes as needed.  25 tablet  11   No current facility-administered medications on file prior to visit.    No Known Allergies  Past Medical History  Diagnosis Date  . CHF (congestive heart failure)     secondary to diastolic dysfunction; last echo in 2009. Normal EF.   Marland Kitchen. History of diastolic dysfunction   . Coronary artery disease     s/p CABG in 2001 with LIMA to OM, SVG to DX, SVG to prox & distal PD  . CKD (chronic kidney disease) stage 4, GFR 15-29 ml/min   . Hypertension   . Pulmonary hypertension   . Hyperlipidemia   . Chronic atrial fibrillation   . SBO (small bowel obstruction)     PARTIAL  . Chronic anticoagulation     stopped coumadin due to profound anemia 05/02/2011  . Anemia     Refuses GI workup  . Weight loss     Past Surgical History  Procedure Laterality Date  . Cardiac catheterization  03/31/1999    EF 70%  . Cholecystectomy    . External ear surgery    . Coronary artery bypass graft  2001     LIMA GRAFT TO THE OBTUSE MARGINAL VESSEL, SAPHENOUS VEIN GRAFT TO THE DIAGONAL , SAPHENOUS VEIN GRAFT TO THE PROXIMAL AND DISTAL PDA, AND SAPHENOUS VEIN GRAFT TO THE POSTERIOR LATERAL BRANCH THE RIGHT CORONARY  . Transthoracic echocardiogram  12/17/2007    EF 55-60%  . [place] echocardiography  05/31/2007    EF 55-60%  . [place] echocardiography  09/21/2004    EF 55-60%  . Cardiovascular stress test  09/22/2004    EF 53%    History  Smoking status  . Never Smoker   Smokeless tobacco  . Not on file    History  Alcohol Use No    Family History  Problem Relation Age of Onset  . Heart attack Mother   . Heart attack Brother   . Hypertension Brother   . Hypertension Sister     Review of Systems: The review of systems is per the HPI.  All other systems were reviewed and are negative.  Physical Exam: BP 96/60  Pulse 74  Ht 5\' 10"  (1.778 m)  Wt 138 lb 7 oz (62.795 kg)  BMI 19.86 kg/m2 Patient is a pleasant elderly male in no acute distress. Chronically ill appearing. Skin is warm and dry. HEENT is unremarkable but has multiple of missing teeth. Normocephalic/atraumatic. PERRL.  Sclera are nonicteric. Neck is supple. No masses. No JVD. Lungs are clear. Cardiac exam shows an irregular rhythm. Rate is controlled. There is a grade 2/6 systolic murmur heard best at the  apex. Abdomen is soft, nontender. Bowel sounds are positive. No masses or bruits. Extremities show 1+ edema. Gait and ROM are intact. No gross neurologic deficits noted.   LABORATORY DATA:  Lab Results  Component Value Date   WBC 6.2 03/27/2012   HGB 11.6* 03/27/2012   HCT 35.5* 03/27/2012   PLT 169.0 03/27/2012   GLUCOSE 95 03/27/2012   CHOL 144 03/27/2012   TRIG 78.0 03/27/2012   HDL 44.60 03/27/2012   LDLCALC 84 03/27/2012   ALT 19 03/27/2012   AST 27 03/27/2012   NA 139 03/27/2012   K 4.5 03/27/2012   CL 101 03/27/2012   CREATININE 2.3* 03/27/2012   BUN 42* 03/27/2012   CO2 30 03/27/2012   TSH 2.60 03/27/2012   PSA 0.64  01/31/2007   INR 3.01* 04/26/2011   HGBA1C  Value: 5.5 (NOTE)   The ADA recommends the following therapeutic goal for glycemic   control related to Hgb A1C measurement:   Goal of Therapy:   < 7.0% Hgb A1C   Reference: American Diabetes Association: Clinical Practice   Recommendations 2008, Diabetes Care,  2008, 31:(Suppl 1). 12/17/2007     Assessment / Plan: 1. Congestive heart failure with chronic diastolic dysfunction. He appears to be well compensated today. Mild edema. Weight is stable. Will continue using lasix only as needed for increased edema. Continue sodium restriction. He is not a candidate for ACE inhibitor or ARB due to chronic kidney disease. He refuses any follow up lab work.  2. Atrial fibrillation, permanent. Rate is well controlled on atenolol. He is not a candidate for anticoagulation given his profound anemia requiring transfusion. He has refused GI evaluation.  3. Chronic kidney disease stage IV.  4. Coronary disease status post CABG in 2001. Denies significant anginal symptoms.  5. Chronic anemia. Patient has refused GI workup. Continue iron supplementation.   Patient refuses any follow up lab work.Will refill medications. I will follow up in 6 months.

## 2014-04-29 ENCOUNTER — Ambulatory Visit (INDEPENDENT_AMBULATORY_CARE_PROVIDER_SITE_OTHER): Payer: Medicare Other | Admitting: Cardiology

## 2014-04-29 ENCOUNTER — Encounter: Payer: Self-pay | Admitting: Cardiology

## 2014-04-29 VITALS — BP 98/54 | HR 96 | Wt 127.6 lb

## 2014-04-29 DIAGNOSIS — I482 Chronic atrial fibrillation, unspecified: Secondary | ICD-10-CM

## 2014-04-29 DIAGNOSIS — I5032 Chronic diastolic (congestive) heart failure: Secondary | ICD-10-CM | POA: Diagnosis not present

## 2014-04-29 DIAGNOSIS — Z951 Presence of aortocoronary bypass graft: Secondary | ICD-10-CM

## 2014-04-29 DIAGNOSIS — I10 Essential (primary) hypertension: Secondary | ICD-10-CM

## 2014-04-29 DIAGNOSIS — I2510 Atherosclerotic heart disease of native coronary artery without angina pectoris: Secondary | ICD-10-CM | POA: Diagnosis not present

## 2014-04-29 DIAGNOSIS — I270 Primary pulmonary hypertension: Secondary | ICD-10-CM

## 2014-04-29 DIAGNOSIS — I272 Pulmonary hypertension, unspecified: Secondary | ICD-10-CM

## 2014-04-29 MED ORDER — NITROGLYCERIN 0.4 MG SL SUBL: 0.4000 mg | SUBLINGUAL_TABLET | SUBLINGUAL | Status: DC | PRN

## 2014-04-29 MED ORDER — LORAZEPAM 0.5 MG PO TABS: ORAL_TABLET | ORAL | Status: DC

## 2014-04-29 MED ORDER — ATENOLOL 50 MG PO TABS: 50.0000 mg | ORAL_TABLET | Freq: Every day | ORAL | Status: AC

## 2014-04-29 MED ORDER — FUROSEMIDE 40 MG PO TABS: 20.0000 mg | ORAL_TABLET | Freq: Every day | ORAL | Status: DC | PRN

## 2014-04-29 NOTE — Patient Instructions (Signed)
We will call for a copy of your last lab work  Try and supplement calorie intake with Ensure or Boost shake twice a day.  I will see you in 6 months

## 2014-04-29 NOTE — Progress Notes (Signed)
Blake Arias Date of Birth: October 17, 1926d Medical Record #[id number]  History of Present Illness: Blake Arias is seen today for follow up CHF and Afib. He has multiple medical issues which include CAD, diastolic dysfunction, chronic atrial fib, chronic renal failure and advanced age. He has a history of anemia requiring transfusion. Coumadin was discontinued because of increased risk. He is seen today with his wife and an interpreter. She states he is doing  well. No chest pain, dyspnea, or palpitations. Takes lasix daily. He has some leg swelling that improves with elevation of legs. He has lost 11 lbs. Eating mostly liquid diet. Denies any chest pain, SOB, palpitations or syncope.   Current Outpatient Prescriptions on File Prior to Visit  Medication Sig Dispense Refill  . ferrous sulfate 325 (65 FE) MG tablet Take 1 tablet (325 mg total) by mouth daily with breakfast.    . Multiple Vitamins-Minerals (CENTRUM SILVER PO) Take by mouth.      . polyethylene glycol powder (GLYCOLAX/MIRALAX) powder Take 1 Capful in 4 to 8oz of water or juice daily 119 g 5   No current facility-administered medications on file prior to visit.    No Known Allergies  Past Medical History  Diagnosis Date  . CHF (congestive heart failure)     secondary to diastolic dysfunction; last echo in 2009. Normal EF.   Blake Arias. History of diastolic dysfunction   . Coronary artery disease     s/p CABG in 2001 with LIMA to OM, SVG to DX, SVG to prox & distal PD  . CKD (chronic kidney disease) stage 4, GFR 15-29 ml/min   . Hypertension   . Pulmonary hypertension   . Hyperlipidemia   . Chronic atrial fibrillation   . SBO (small bowel obstruction)     PARTIAL  . Chronic anticoagulation     stopped coumadin due to profound anemia 05/02/2011  . Anemia     Refuses GI workup  . Weight loss     Past Surgical History  Procedure Laterality Date  . Cardiac catheterization  03/31/1999    EF 70%  . Cholecystectomy    . External ear  surgery    . Coronary artery bypass graft  2001    LIMA GRAFT TO THE OBTUSE MARGINAL VESSEL, SAPHENOUS VEIN GRAFT TO THE DIAGONAL , SAPHENOUS VEIN GRAFT TO THE PROXIMAL AND DISTAL PDA, AND SAPHENOUS VEIN GRAFT TO THE POSTERIOR LATERAL BRANCH THE RIGHT CORONARY  . Transthoracic echocardiogram  12/17/2007    EF 55-60%  . [place] echocardiography  05/31/2007    EF 55-60%  . [place] echocardiography  09/21/2004    EF 55-60%  . Cardiovascular stress test  09/22/2004    EF 53%    History  Smoking status  . Never Smoker   Smokeless tobacco  . Not on file    History  Alcohol Use No    Family History  Problem Relation Age of Onset  . Heart attack Mother   . Heart attack Brother   . Hypertension Brother   . Hypertension Sister     Review of Systems: The review of systems is per the HPI.  All other systems were reviewed and are negative.  Physical Exam: BP 98/54 mmHg  Pulse 96  Wt 127 lb 9.6 oz (57.879 kg) Patient is a pleasant thin, elderly male in no acute distress. Chronically ill appearing. Skin is warm and dry. HEENT is unremarkable but has multiple of missing teeth. Normocephalic/atraumatic. PERRL. Sclera are nonicteric. Neck is supple. No  masses. No JVD. Lungs are clear. Cardiac exam shows an irregular rhythm. Rate is controlled. There is a grade 2/6 systolic murmur heard best at the  apex. Abdomen is soft, nontender. Bowel sounds are positive. No masses or bruits. Extremities show 1-2+ ankle edema. No gross neurologic deficits noted.   LABORATORY DATA:  Lab Results  Component Value Date   WBC 6.2 03/27/2012   HGB 11.6* 03/27/2012   HCT 35.5* 03/27/2012   PLT 169.0 03/27/2012   GLUCOSE 95 03/27/2012   CHOL 144 03/27/2012   TRIG 78.0 03/27/2012   HDL 44.60 03/27/2012   LDLCALC 84 03/27/2012   ALT 19 03/27/2012   AST 27 03/27/2012   NA 139 03/27/2012   K 4.5 03/27/2012   CL 101 03/27/2012   CREATININE 2.3* 03/27/2012   BUN 42* 03/27/2012   CO2 30 03/27/2012   TSH 2.60  03/27/2012   PSA 0.64 01/31/2007   INR 3.01* 04/26/2011   HGBA1C  12/17/2007    5.5 (NOTE)   The ADA recommends the following therapeutic goal for glycemic   control related to Hgb A1C measurement:   Goal of Therapy:   < 7.0% Hgb A1C   Reference: American Diabetes Association: Clinical Practice   Recommendations 2008, Diabetes Care,  2008, 31:(Suppl 1).   Ecg today shows Afib with rate 96 bpm. RBBB, LAFB. I have personally reviewed and interpreted this study.  Assessment / Plan: 1. Congestive heart failure with chronic diastolic dysfunction. He appears to be fairly well compensated today. Ankle edema. Weight is down due to poor intake. Will continue using lasix daily. Continue sodium restriction. He is not a candidate for ACE inhibitor or ARB due to chronic kidney disease.   2. Atrial fibrillation, permanent. Rate is well controlled on atenolol. He is not a candidate for anticoagulation given his profound anemia requiring transfusion. He has refused GI evaluation.  3. Chronic kidney disease stage IV.  4. Coronary disease status post CABG in 2001. Denies significant anginal symptoms.  5. Chronic anemia. Patient has refused GI workup. Continue iron supplementation.   I have requested a copy of his lab work from primary care in Dec. Recommend he supplement diet with Ensure or Boost shakes.  I will follow up in 6 months.

## 2014-06-19 ENCOUNTER — Emergency Department (HOSPITAL_COMMUNITY): Payer: Medicare Other

## 2014-06-19 ENCOUNTER — Inpatient Hospital Stay (HOSPITAL_COMMUNITY)
Admission: EM | Admit: 2014-06-19 | Discharge: 2014-06-24 | DRG: 871 | Disposition: A | Discharge status: Hospice | Payer: Medicare Other | Attending: Internal Medicine | Admitting: Internal Medicine

## 2014-06-19 DIAGNOSIS — Z79899 Other long term (current) drug therapy: Secondary | ICD-10-CM

## 2014-06-19 DIAGNOSIS — I2510 Atherosclerotic heart disease of native coronary artery without angina pectoris: Secondary | ICD-10-CM | POA: Diagnosis present

## 2014-06-19 DIAGNOSIS — R7989 Other specified abnormal findings of blood chemistry: Secondary | ICD-10-CM | POA: Diagnosis not present

## 2014-06-19 DIAGNOSIS — J189 Pneumonia, unspecified organism: Secondary | ICD-10-CM

## 2014-06-19 DIAGNOSIS — R778 Other specified abnormalities of plasma proteins: Secondary | ICD-10-CM

## 2014-06-19 DIAGNOSIS — R6521 Severe sepsis with septic shock: Secondary | ICD-10-CM | POA: Diagnosis present

## 2014-06-19 DIAGNOSIS — G9340 Encephalopathy, unspecified: Secondary | ICD-10-CM | POA: Diagnosis present

## 2014-06-19 DIAGNOSIS — Z66 Do not resuscitate: Secondary | ICD-10-CM | POA: Diagnosis present

## 2014-06-19 DIAGNOSIS — N179 Acute kidney failure, unspecified: Secondary | ICD-10-CM | POA: Diagnosis not present

## 2014-06-19 DIAGNOSIS — I272 Other secondary pulmonary hypertension: Secondary | ICD-10-CM | POA: Diagnosis present

## 2014-06-19 DIAGNOSIS — J690 Pneumonitis due to inhalation of food and vomit: Secondary | ICD-10-CM | POA: Diagnosis present

## 2014-06-19 DIAGNOSIS — E7800 Pure hypercholesterolemia, unspecified: Secondary | ICD-10-CM | POA: Diagnosis present

## 2014-06-19 DIAGNOSIS — Z951 Presence of aortocoronary bypass graft: Secondary | ICD-10-CM

## 2014-06-19 DIAGNOSIS — I248 Other forms of acute ischemic heart disease: Secondary | ICD-10-CM | POA: Diagnosis present

## 2014-06-19 DIAGNOSIS — I482 Chronic atrial fibrillation, unspecified: Secondary | ICD-10-CM | POA: Diagnosis present

## 2014-06-19 DIAGNOSIS — Z8249 Family history of ischemic heart disease and other diseases of the circulatory system: Secondary | ICD-10-CM

## 2014-06-19 DIAGNOSIS — E43 Unspecified severe protein-calorie malnutrition: Secondary | ICD-10-CM | POA: Insufficient documentation

## 2014-06-19 DIAGNOSIS — Z515 Encounter for palliative care: Secondary | ICD-10-CM

## 2014-06-19 DIAGNOSIS — N184 Chronic kidney disease, stage 4 (severe): Secondary | ICD-10-CM | POA: Diagnosis present

## 2014-06-19 DIAGNOSIS — R627 Adult failure to thrive: Secondary | ICD-10-CM | POA: Diagnosis present

## 2014-06-19 DIAGNOSIS — R1310 Dysphagia, unspecified: Secondary | ICD-10-CM | POA: Diagnosis present

## 2014-06-19 DIAGNOSIS — R64 Cachexia: Secondary | ICD-10-CM | POA: Diagnosis present

## 2014-06-19 DIAGNOSIS — E785 Hyperlipidemia, unspecified: Secondary | ICD-10-CM | POA: Diagnosis present

## 2014-06-19 DIAGNOSIS — I129 Hypertensive chronic kidney disease with stage 1 through stage 4 chronic kidney disease, or unspecified chronic kidney disease: Secondary | ICD-10-CM | POA: Diagnosis present

## 2014-06-19 DIAGNOSIS — H9190 Unspecified hearing loss, unspecified ear: Secondary | ICD-10-CM | POA: Diagnosis present

## 2014-06-19 DIAGNOSIS — A419 Sepsis, unspecified organism: Secondary | ICD-10-CM | POA: Diagnosis present

## 2014-06-19 DIAGNOSIS — Z681 Body mass index (BMI) 19 or less, adult: Secondary | ICD-10-CM | POA: Diagnosis not present

## 2014-06-19 DIAGNOSIS — F419 Anxiety disorder, unspecified: Secondary | ICD-10-CM | POA: Diagnosis present

## 2014-06-19 DIAGNOSIS — I959 Hypotension, unspecified: Secondary | ICD-10-CM | POA: Diagnosis present

## 2014-06-19 DIAGNOSIS — I214 Non-ST elevation (NSTEMI) myocardial infarction: Secondary | ICD-10-CM | POA: Diagnosis present

## 2014-06-19 DIAGNOSIS — L8990 Pressure ulcer of unspecified site, unspecified stage: Secondary | ICD-10-CM | POA: Diagnosis present

## 2014-06-19 DIAGNOSIS — R079 Chest pain, unspecified: Secondary | ICD-10-CM | POA: Diagnosis not present

## 2014-06-19 HISTORY — DX: Pneumonia, unspecified organism: J18.9

## 2014-06-19 HISTORY — DX: Cardiac arrhythmia, unspecified: I49.9

## 2014-06-19 LAB — CBC
HEMATOCRIT: 33.7 % — AB (ref 39.0–52.0)
Hemoglobin: 10.5 g/dL — ABNORMAL LOW (ref 13.0–17.0)
MCH: 31.7 pg (ref 26.0–34.0)
MCHC: 31.2 g/dL (ref 30.0–36.0)
MCV: 101.8 fL — ABNORMAL HIGH (ref 78.0–100.0)
Platelets: 143 10*3/uL — ABNORMAL LOW (ref 150–400)
RBC: 3.31 MIL/uL — ABNORMAL LOW (ref 4.22–5.81)
RDW: 14.2 % (ref 11.5–15.5)
WBC: 9 10*3/uL (ref 4.0–10.5)

## 2014-06-19 LAB — BRAIN NATRIURETIC PEPTIDE: B Natriuretic Peptide: 666.4 pg/mL — ABNORMAL HIGH (ref 0.0–100.0)

## 2014-06-19 LAB — I-STAT TROPONIN, ED: TROPONIN I, POC: 0.18 ng/mL — AB (ref 0.00–0.08)

## 2014-06-19 LAB — COMPREHENSIVE METABOLIC PANEL
ALT: 19 U/L (ref 17–63)
ANION GAP: 13 (ref 5–15)
AST: 38 U/L (ref 15–41)
Albumin: 2.6 g/dL — ABNORMAL LOW (ref 3.5–5.0)
Alkaline Phosphatase: 64 U/L (ref 38–126)
BUN: 63 mg/dL — ABNORMAL HIGH (ref 6–20)
CO2: 32 mmol/L (ref 22–32)
Calcium: 8.3 mg/dL — ABNORMAL LOW (ref 8.9–10.3)
Chloride: 94 mmol/L — ABNORMAL LOW (ref 101–111)
Creatinine, Ser: 3.47 mg/dL — ABNORMAL HIGH (ref 0.61–1.24)
GFR calc non Af Amer: 15 mL/min — ABNORMAL LOW (ref >60)
GFR, EST AFRICAN AMERICAN: 17 mL/min — AB (ref >60)
Glucose, Bld: 99 mg/dL (ref 65–99)
POTASSIUM: 4.1 mmol/L (ref 3.5–5.1)
SODIUM: 139 mmol/L (ref 135–145)
TOTAL PROTEIN: 6.2 g/dL — AB (ref 6.5–8.1)
Total Bilirubin: 1.9 mg/dL — ABNORMAL HIGH (ref 0.3–1.2)

## 2014-06-19 LAB — I-STAT CG4 LACTIC ACID, ED
Lactic Acid, Venous: 2.8 mmol/L (ref 0.5–2.0)
Lactic Acid, Venous: 1.07 mmol/L (ref 0.5–2.0)

## 2014-06-19 LAB — TROPONIN I: Troponin I: 0.19 ng/mL — ABNORMAL HIGH (ref <0.031)

## 2014-06-19 MED ORDER — CEFTRIAXONE SODIUM IN DEXTROSE 20 MG/ML IV SOLN
1.0000 g | INTRAVENOUS | Status: DC
  Filled 2014-06-19: qty 50

## 2014-06-19 MED ORDER — SODIUM CHLORIDE 0.9 % IV BOLUS (SEPSIS)
500.0000 mL | Freq: Once | INTRAVENOUS | Status: AC
  Administered 2014-06-19: 500 mL via INTRAVENOUS

## 2014-06-19 MED ORDER — ASPIRIN 325 MG PO TABS
325.0000 mg | ORAL_TABLET | ORAL | Status: AC
  Administered 2014-06-19: 325 mg via ORAL
  Filled 2014-06-19: qty 1

## 2014-06-19 MED ORDER — DEXTROSE 5 % IV SOLN
1.0000 g | Freq: Once | INTRAVENOUS | Status: AC
  Administered 2014-06-19: 1 g via INTRAVENOUS
  Filled 2014-06-19: qty 10

## 2014-06-19 MED ORDER — NITROGLYCERIN 0.4 MG SL SUBL: 0.4000 mg | SUBLINGUAL_TABLET | SUBLINGUAL | Status: DC | PRN

## 2014-06-19 MED ORDER — ENOXAPARIN SODIUM 30 MG/0.3ML ~~LOC~~ SOLN
30.0000 mg | SUBCUTANEOUS | Status: DC
  Administered 2014-06-20 – 2014-06-22 (×3): 30 mg via SUBCUTANEOUS
  Filled 2014-06-19 (×5): qty 0.3

## 2014-06-19 MED ORDER — DEXTROSE 5 % IV SOLN
500.0000 mg | INTRAVENOUS | Status: DC
  Filled 2014-06-19: qty 500

## 2014-06-19 MED ORDER — ACETAMINOPHEN 650 MG RE SUPP
650.0000 mg | Freq: Four times a day (QID) | RECTAL | Status: DC | PRN
  Administered 2014-06-20: 650 mg via RECTAL
  Filled 2014-06-19: qty 1

## 2014-06-19 MED ORDER — DEXTROSE 5 % IV SOLN
500.0000 mg | Freq: Once | INTRAVENOUS | Status: AC
  Administered 2014-06-19: 500 mg via INTRAVENOUS
  Filled 2014-06-19 (×2): qty 500

## 2014-06-19 MED ORDER — ACETAMINOPHEN 325 MG PO TABS: 650.0000 mg | ORAL_TABLET | Freq: Four times a day (QID) | ORAL | Status: DC | PRN

## 2014-06-19 MED ORDER — ONDANSETRON HCL 4 MG PO TABS: 4.0000 mg | ORAL_TABLET | Freq: Four times a day (QID) | ORAL | Status: DC | PRN

## 2014-06-19 MED ORDER — ONDANSETRON HCL 4 MG/2ML IJ SOLN
4.0000 mg | Freq: Four times a day (QID) | INTRAMUSCULAR | Status: DC | PRN
Start: 2014-06-19 — End: 2014-06-24

## 2014-06-19 MED ORDER — DEXTROSE 5 % IV SOLN: 1.0000 g | INTRAVENOUS | Status: DC

## 2014-06-19 MED ORDER — AZITHROMYCIN 500 MG IV SOLR: 500.0000 mg | INTRAVENOUS | Status: DC

## 2014-06-19 MED ORDER — SODIUM CHLORIDE 0.9 % IV SOLN
INTRAVENOUS | Status: DC
  Administered 2014-06-20 (×3): via INTRAVENOUS

## 2014-06-19 MED ORDER — SODIUM CHLORIDE 0.9 % IV BOLUS (SEPSIS)
1000.0000 mL | Freq: Once | INTRAVENOUS | Status: AC
  Administered 2014-06-19: 1000 mL via INTRAVENOUS

## 2014-06-19 NOTE — ED Notes (Signed)
MD at bedside. 

## 2014-06-19 NOTE — ED Notes (Signed)
This RN attempted a second IV.  Unable to gain access.  Will follow up with other team members

## 2014-06-19 NOTE — ED Notes (Signed)
This RN went in with interpreter phone.  Pt sleeping peacefully.  This RN gave a full update to pt's wife.  Pt's wife appreciative of information.  Pt's wife also concerned about "blisters" on pt's bottom due to pt being in bed a lot.  This RN will assess with admitting.

## 2014-06-19 NOTE — H&P (Addendum)
[facility] Admission History and Physical       Blake Arias ZOX:[id number] DOB: 04/13/1924 DOA: 06/19/2014  Referring physician: EDP PCP: Dorrene German, MD  Specialists:   Chief Complaint:   HPI: Blake Arias is a 81 y.o. male from [place] with a history of CAD S/P CABG, Chronic Atrial Fibrillation, CHF, HTN, Hyperlipidemia, and Stage IV CKD who was brought to the ED due to complaints of Chest congestion, Cough and chest pain worsening over 3 days.   He has had increased weakness and poor appetite as well .    He was evaluated in the ED with the use of the translation phone, and his wife who is at the bedside provides the history.   He was found to have a LLL Infiltrate on chest X-ray as well as a BNP of 666.6.  A Sepsis workup was initiated and he ws placed on IV Rocephin and Azithromycin to cover CAP pneumonia.        Review of Systems: Unable to Obtain from the Patient  Past Medical History  Diagnosis Date  . CHF (congestive heart failure)     secondary to diastolic dysfunction; last echo in 2009. Normal EF.   Marland Kitchen History of diastolic dysfunction   . Coronary artery disease     s/p CABG in 2001 with LIMA to OM, SVG to DX, SVG to prox & distal PD  . CKD (chronic kidney disease) stage 4, GFR 15-29 ml/min   . Hypertension   . Pulmonary hypertension   . Hyperlipidemia   . Chronic atrial fibrillation   . SBO (small bowel obstruction)     PARTIAL  . Chronic anticoagulation     stopped coumadin due to profound anemia 05/02/2011  . Anemia     Refuses GI workup  . Weight loss      Past Surgical History  Procedure Laterality Date  . Cardiac catheterization  03/31/1999    EF 70%  . Cholecystectomy    . External ear surgery    . Coronary artery bypass graft  2001    LIMA GRAFT TO THE OBTUSE MARGINAL VESSEL, SAPHENOUS VEIN GRAFT TO THE DIAGONAL , SAPHENOUS VEIN GRAFT TO THE PROXIMAL AND DISTAL PDA, AND SAPHENOUS VEIN GRAFT TO THE POSTERIOR LATERAL BRANCH THE RIGHT  CORONARY  . Transthoracic echocardiogram  12/17/2007    EF 55-60%  . US echocardiography  05/31/2007    EF 55-60%  . US echocardiography  09/21/2004    EF 55-60%  . Cardiovascular stress test  09/22/2004    EF 53%      Prior to Admission medications   Medication Sig Start Date End Date Taking? Authorizing Provider  atenolol (TENORMIN) 50 MG tablet Take 1 tablet (50 mg total) by mouth daily. 04/29/14  Yes Peter M Swazil[place], MD  furosemide (LASIX) 40 MG tablet Take 0.5 tablets (20 mg total) by mouth daily as needed for edema. 04/29/14  Yes Peter M [place], MD  LORazepam (ATIVAN) 0.5 MG tablet Take 0.5 mg three times a day 04/29/14  Yes Peter M [place], MD  Multiple Vitamins-Minerals (CENTRUM SILVER PO) Take by mouth.     Yes Historical Provider, MD  nitroGLYCERIN (NITROSTAT) 0.4 MG SL tablet Place 1 tablet (0.4 mg total) under the tongue every 5 (five) minutes as needed. 04/29/14  Yes Peter M [place], MD  polyethylene glycol powder [facility]) powder Take 1 Capful in 4 to 8oz of water or juice daily 10/10/13  Yes Peter M [place], MD  ferrous sulfate 325 (65  FE) MG tablet Take 1 tablet (325 mg total) by mouth daily with breakfast. Patient not taking: Reported on 06/19/2014 04/07/13   Peter M [place]az[place], MD     No Known Allergies  Social History:  reports that he has never smoked. He does not have any smokeless tobacco history on file. He reports that he does not drink alcohol or use illicit drugs.    Family History  Problem Relation Age of Onset  . Heart attack Mother   . Heart attack Brother   . Hypertension Brother   . Hypertension Sister        Physical Exam:  GEN: Pleasant  Elderly Cachectic 81 y.o. Caucasian male examined and in no acute distress; cooperative with exam Filed Vitals:   06/19/14 2045 06/19/14 2100 06/19/14 2200 06/19/14 2215  BP: 102/45  Pulse: 98 89 73 78  Temp:      TempSrc:      Resp: SpO2: 100% 100% 100% 100%   Blood pressure  102/45, pulse 78, temperature 98.6 F (37 C), temperature source Oral, resp. rate 20, SpO2 100 %. PSYCH: He is alert and oriented x1; does not appear anxious does not appear depressed; affect is normal HEENT: Normocephalic and Atraumatic, Mucous membranes pink; PERRLA; EOM intact; Fundi:  Benign;  No scleral icterus, Nares: Patent, Oropharynx: Clear, Edentulous,    Neck:  FROM, No Cervical Lymphadenopathy nor Thyromegaly or Carotid Bruit; No JVD; Breasts:: Not examined CHEST WALL: No tenderness CHEST: Normal respiration, clear to auscultation bilaterally HEART: Regular rate and rhythm; no murmurs rubs or gallops BACK: No kyphosis or scoliosis; No CVA tenderness ABDOMEN: Positive Bowel Sounds, Scaphoid, Soft Non-Tender, No Rebound or Guarding; No Masses, No Organomegaly. Rectal Exam: Not done EXTREMITIES: No Cyanosis, Clubbing, or Edema; No Ulcerations. Genitalia: not examined PULSES: 2+ and symmetric SKIN: Normal hydration no rash or ulceration CNS:  Alert and Oriented x 4, No Focal Deficits, Generalized Weakness Vascular: pulses palpable throughout    Labs on Admission:  Basic Metabolic Panel:  Recent Labs Lab 06/19/14 1725  NA 139  K 4.1  CL 94*  CO2 32  GLUCOSE 99  BUN 63*  CREATININE 3.47*  CALCIUM 8.3*   Liver Function Tests:  Recent Labs Lab 06/19/14 1725  AST 38  ALT 19  ALKPHOS 64  BILITOT 1.9*  PROT 6.2*  ALBUMIN 2.6*   No results for input(s): LIPASE, AMYLASE in the last 168 hours. No results for input(s): AMMONIA in the last 168 hours. CBC: No results for input(s): WBC, NEUTROABS, HGB, HCT, MCV, PLT in the last 168 hours. Cardiac Enzymes:  Recent Labs Lab 06/19/14 1725  TROPONINI 0.19*    BNP (last 3 results)  Recent Labs  06/19/14 1725  BNP 666.4*    ProBNP (last 3 results) No results for input(s): PROBNP in the last 8760 hours.  CBG: No results for input(s): GLUCAP in the last 168 hours.  Radiological Exams on Admission: Dg Chest  Port 1 View  06/19/2014   CLINICAL DATA:  Chest pain and cough with congestion  EXAM: PORTABLE CHEST - 1 VIEW  COMPARISON:  06/11/2009  FINDINGS: Moderate enlargement of cardiac silhouette is reidentified with evidence of prior median sternotomy. Dense left lower lobe consolidation is present. Small left pleural effusion. Right lung is grossly clear but detail is obscured by overlying cardiac leads. No acute osseous abnormality.  IMPRESSION: Dense left lower lobe consolidation which could indicate pneumonia but would be best evaluated at  PA and lateral chest radiographs when the patient is clinically able.   Electronically Signed   By: Christiana Pellant M.D.   On: 06/19/2014 18:48     EKG: Independently reviewed.   Ordered      Assessment/Plan:   81 y.o. male with  Principal Problem:   1.    Sepsis/Septic shock   Sepsis Protocol   Adjust Antibiotics, add Vancomycin    IVFs  Active Problems:   2.    CAP (community acquired pneumonia)   IV Rocephin and Azithromycin     3.   Hypotension   IVFs   Hold Anti-Hypertensives     4.   AKI (acute kidney injury)/w CKD (chronic kidney disease) stage 4, GFR 15-29 ml/min   IVFs   Monitor BUN/Cr     5.   Coronary atherosclerosis/ Chest Pain/ elevated Troponin   Cardiac monitoring   Cycle Troponins   Cards: He sees Dr Levan Hurst by Cards in ED    6.   Chronic atrial fibrillation   Not a Coumadin Candidate discontinued due to Anemia     7.   HYPERCHOLESTEROLEMIA   Not on therapy currently      8.   DVT Prophylaxis   SQ Lovenox     Code Status:     FULL CODE     Family Communication:   Wife at Bedside   Disposition Plan:    Inpatient Status        Time spent:  54 Minutes    Ron Parker [facility] Pager [phone]   If 7AM -7PM Please Contact the Day Rounding Team MD for [facility]  If 7PM-7AM, Please Contact Night-Floor Coverage  [email or web address] Password [email or web address] 06/19/2014, 10:56 PM     ADDENDUM:   Patient  was seen and examined on 06/19/2014

## 2014-06-19 NOTE — ED Notes (Signed)
Had placed pt on bedpan after his wife was telling him he "could not get up and go" pt and wife were yelling back and forth in their native language; pt gets upset with wife and throws feces at her; wife yells at pt "you idiot"; I tell the pt no don't do that; I then bath the pt with soap and water, change stretcher linens, place chuks underneath pt; Erin, EMT comes in to help me readjust pt up on stretcher; placed clean sheet over pt, Erin, EMT places a blanket on top of sheet and wife is sitting at bedside speaking to pt in their native language calmly then says to me "thank you for cleaning"; Florentina Addison, RN made aware

## 2014-06-19 NOTE — Consult Note (Signed)
CARDIOLOGY CONSULT NOTE  Assessment and Plan:  *Chest pain: Blake Arias, a patient of Dr. Peter Swaziland, is an 81 year old male with CAD s/p CABG, diastolic heart failure, CKD stage IV, hypertension, dyslipidemia, chronic atrial fibrillation comes to the emergency department with chest pain. Patient has history of coronary artery disease with bypass about 15 years ago. No percutaneous coronary interventions after the bypass. Now he is endorsing symptoms that per wife are similar to the symptoms that led to his bypass surgery. Certainly, differential includes acute coronary syndrome; however, a chest x-ray shows of the left lower lobe infiltrate (this has been seen in prior chest x-rays but not as pronounced). There is concern for new cough and increasing sputum production suggesting a community-acquired pneumonia.  EKG shows baseline changes of atrial fibrillation with right bundle branch block and left anterior fascicular block. Initial set of troponins are elevated at 0.19. At this point, due to patient's history of chronic kidney disease, GI bleeding on anticoagulation, we recommend conservative management for possible unstable angina.  --Give aspirin 325 mg 1 followed by aspirin 81 mg daily. --Would not recommend continuing beta blockers at this time due to relative hypotension. --Monitor on telemetry. Cycle cardiac enzymes 3. --Please obtain a transthoracic echocardiogram. --Defer management of suspected aspiration/community-acquired pneumonia treatment to the primary team. --Avoid systemic anticoagulation at this time due to history of GI bleeding. Reportedly, patient has deferred GI bleeding workup in the past.  Thank you very much for this very interesting consult. We'll continue to follow along. These do not hesitate to call the on-call cardiology pager with any questions.  Primary cardiologist: Dr. Peter Swaziland Chief complaint: Chest pain  HPI:  Blake Arias is an 81 year old male  with history of CAD s/p CABG, diastolic heart failure, CK D, hypertension, dyslipidemia, chronic atrial fibrillation comes to the emergency department with chest pain, generalized weakness and cough. Patient only speaks [place] and has functionally deaf. All of the history is obtained by talking to the wife via an interpreter. Wife states that she has to speak very loudly to be able to communicate with her husband and communicates at times with writing. Has been only understands Engineer, manufacturing.  Patient has had generalized decline in his functional status over past few months with difficulty with swallowing food. He has been overall progressively developing generalized weakness. About 2 days ago, patient started having left-sided chest pains, which were constant for many hours. However, the pain worsened today which brought the patient to the hospital. Wife states that the pain is resolved now. Wife noticed chills and mildly productive cough but denies any fevers. Of note, patient felt that his symptoms were similar in character as the episode in 2001 that led to his bypass surgery. Other than that, wife has not noticed any orthopnea, PND or lower extremity edema. No other sick contacts.  Past Medical History Past Medical History  Diagnosis Date  . CHF (congestive heart failure)     secondary to diastolic dysfunction; last echo in 2009. Normal EF.   Marland Kitchen History of diastolic dysfunction   . Coronary artery disease     s/p CABG in 2001 with LIMA to OM, SVG to DX, SVG to prox & distal PD  . CKD (chronic kidney disease) stage 4, GFR 15-29 ml/min   . Hypertension   . Pulmonary hypertension   . Hyperlipidemia   . Chronic atrial fibrillation   . SBO (small bowel obstruction)     PARTIAL  . Chronic anticoagulation  stopped coumadin due to profound anemia 05/02/2011  . Anemia     Refuses GI workup  . Weight loss     Allergies: No Known Allergies  Social History History   Social History  .  Marital Status: Married    Spouse Name: N/A  . Number of Children: 2  . Years of Education: N/A   Occupational History  . RETIRED    Social History Main Topics  . Smoking status: Never Smoker   . Smokeless tobacco: Not on file  . Alcohol Use: No  . Drug Use: No  . Sexual Activity: No   Other Topics Concern  . Not on file   Social History Narrative    Family History Family History  Problem Relation Age of Onset  . Heart attack Mother   . Heart attack Brother   . Hypertension Brother   . Hypertension Sister    Current outpatient prescriptions:  .  atenolol (TENORMIN) 50 MG tablet, Take 1 tablet (50 mg total) by mouth daily. .  furosemide (LASIX) 40 MG tablet, Take 0.5 tablets (20 mg total) by mouth daily as needed for edema. Marland Kitchen  LORazepam (ATIVAN) 0.5 MG tablet, Take 0.5 mg three times a day,.  nitroGLYCERIN (NITROSTAT) 0.4 MG SL tablet, Place 1 tablet (0.4 mg total) under the tongue every 5 (five) minutes as needed. .  polyethylene glycol powder (GLYCOLAX/MIRALAX) powder, Take 1 Capful in 4 to 8oz of water or juice daily,  Physical Exam Filed Vitals:   06/19/14 1845  BP: 97/48  Pulse: 102  Temp:   Resp: 25    Gen: Comfortable appearing and appears to be resting no acute distress, cachectic  HEENT:Extraocular motions intact, dry mucous membranes  Neck:Supple with elevated jugular venous pressure, question large V waves  ZO:[id number] irregular, variable S1 and S2, 3/6 holosystolic murmur best heard at the apex, +2 pulses in bilateral upper extremities  Pulm: Normal worker breathing at rest, rhonchi's auscultated bilaterally  Abdomen:Soft, nontender, nondistended  Ext:No cyanosis clubbing or edema  Labs:  Results for orders placed or performed during the hospital encounter of 06/19/14 (from the past 24 hour(s))  Troponin I     Status: Abnormal   Collection Time: 06/19/14  5:25 PM  Result Value Ref Range   Troponin I 0.19 (H) <0.031 ng/mL  Comprehensive metabolic  panel     Status: Abnormal   Collection Time: 06/19/14  5:25 PM  Result Value Ref Range   Sodium 139 135 - 145 mmol/L   Potassium 4.1 3.5 - 5.1 mmol/L   Chloride 94 (L) 101 - 111 mmol/L   CO2 32 22 - 32 mmol/L   Glucose, Bld 99 65 - 99 mg/dL   BUN 63 (H) 6 - 20 mg/dL   Creatinine, Ser 1.19 (H) 0.61 - 1.24 mg/dL   Calcium 8.3 (L) 8.9 - 10.3 mg/dL   Total Protein 6.2 (L) 6.5 - 8.1 g/dL   Albumin 2.6 (L) 3.5 - 5.0 g/dL   AST 38 15 - 41 U/L   ALT 19 17 - 63 U/L   Alkaline Phosphatase 64 38 - 126 U/L   Total Bilirubin 1.9 (H) 0.3 - 1.2 mg/dL   GFR calc non Af Amer 15 (L) >60 mL/min   GFR calc Af Amer 17 (L) >60 mL/min   Anion gap 13 5 - 15  Brain natriuretic peptide     Status: Abnormal   Collection Time: 06/19/14  5:25 PM  Result Value Ref Range   B Natriuretic  Peptide 666.4 (H) 0.0 - 100.0 pg/mL  I-Stat CG4 Lactic Acid, ED     Status: Abnormal   Collection Time: 06/19/14  5:36 PM  Result Value Ref Range   Lactic Acid, Venous 2.80 (HH) 0.5 - 2.0 mmol/L   Comment NOTIFIED PHYSICIAN   I-stat troponin, ED  (not at [facility])     Status: Abnormal   Collection Time: 06/19/14  5:52 PM  Result Value Ref Range   Troponin i, poc 0.18 (HH) 0.00 - 0.08 ng/mL   Comment NOTIFIED PHYSICIAN    Comment 3

## 2014-06-19 NOTE — ED Notes (Signed)
Admitting at bedside using interpreter phone with wife.

## 2014-06-19 NOTE — ED Provider Notes (Signed)
CSN: [id number]     Arrival date & time 06/19/14  1655 History   First MD Initiated Contact with Patient 06/19/14 1659     Chief Complaint  Patient presents with  . Chest Pain     (Consider location/radiation/quality/duration/timing/severity/associated sxs/prior Treatment) HPI Comments: Patient here complaining of left-sided nonradiating chest pain 2 days. Patient uses Ativan as well as nitroglycerin which did help her symptoms somewhat. Information was obtained via the use of the telephone interpreter. He denies any fever or cough. No exertional component to his symptoms. Denies any syncope or nursing.. Does have a history of coronary artery disease. Denies any chest pain currently.  Patient is a 81 y.o. male presenting with chest pain. The history is provided by the patient. The history is limited by a language barrier. A language interpreter was used.  Chest Pain   Past Medical History  Diagnosis Date  . CHF (congestive heart failure)     secondary to diastolic dysfunction; last echo in 2009. Normal EF.   Marland Kitchen History of diastolic dysfunction   . Coronary artery disease     s/p CABG in 2001 with LIMA to OM, SVG to DX, SVG to prox & distal PD  . CKD (chronic kidney disease) stage 4, GFR 15-29 ml/min   . Hypertension   . Pulmonary hypertension   . Hyperlipidemia   . Chronic atrial fibrillation   . SBO (small bowel obstruction)     PARTIAL  . Chronic anticoagulation     stopped coumadin due to profound anemia 05/02/2011  . Anemia     Refuses GI workup  . Weight loss    Past Surgical History  Procedure Laterality Date  . Cardiac catheterization  03/31/1999    EF 70%  . Cholecystectomy    . External ear surgery    . Coronary artery bypass graft  2001    LIMA GRAFT TO THE OBTUSE MARGINAL VESSEL, SAPHENOUS VEIN GRAFT TO THE DIAGONAL , SAPHENOUS VEIN GRAFT TO THE PROXIMAL AND DISTAL PDA, AND SAPHENOUS VEIN GRAFT TO THE POSTERIOR LATERAL BRANCH THE RIGHT CORONARY  . Transthoracic  echocardiogram  12/17/2007    EF 55-60%  . US echocardiography  05/31/2007    EF 55-60%  . US echocardiography  09/21/2004    EF 55-60%  . Cardiovascular stress test  09/22/2004    EF 53%   Family History  Problem Relation Age of Onset  . Heart attack Mother   . Heart attack Brother   . Hypertension Brother   . Hypertension Sister    History  Substance Use Topics  . Smoking status: Never Smoker   . Smokeless tobacco: Not on file  . Alcohol Use: No    Review of Systems  Cardiovascular: Positive for chest pain.  All other systems reviewed and are negative.     Allergies  Review of patient's allergies indicates no known allergies.  Home Medications   Prior to Admission medications   Medication Sig Start Date End Date Taking? Authorizing Provider  atenolol (TENORMIN) 50 MG tablet Take 1 tablet (50 mg total) by mouth daily. 04/29/14   Peter M Swaziland, MD  ferrous sulfate 325 (65 FE) MG tablet Take 1 tablet (325 mg total) by mouth daily with breakfast. 04/07/13   Peter M [place]azil[place], MD  furosemide (LASIX) 40 MG tablet Take 0.5 tablets (20 mg total) by mouth daily as needed for edema. 04/29/14   Peter M [place], MD  LORazepam (ATIVAN) 0.5 MG tablet Take 0.5 mg three times  a day 04/29/14   Peter M [place]aziland, MD  Multiple Vitamins-Minerals (CENTRUM SILVER PO) Take by mouth.      Historical Provider, MD  nitroGLYCERIN (NITROSTAT) 0.4 MG SL tablet Place 1 tablet (0.4 mg total) under the tongue every 5 (five) minutes as needed. 04/29/14   Peter M Swaziland, MD  polyethylene glycol powder [facility]) powder Take 1 Capful in 4 to 8oz of water or juice daily 10/10/13   Peter M Swaziland, MD   BP 98/58 mmHg  Pulse 121  Temp(Src) 98.6 F (37 C) (Oral)  Resp 16  SpO2 94% Physical Exam  Constitutional: He is oriented to person, place, and time. He appears well-developed and well-nourished.  Non-toxic appearance. No distress.  HENT:  Head: Normocephalic and atraumatic.  Eyes: Conjunctivae, EOM and  lids are normal. Pupils are equal, round, and reactive to light.  Neck: Normal range of motion. Neck supple. No tracheal deviation present. No thyroid mass present.  Cardiovascular: Normal rate, regular rhythm and normal heart sounds.  Exam reveals no gallop.   No murmur heard. Pulmonary/Chest: Effort normal and breath sounds normal. No stridor. No respiratory distress. He has no decreased breath sounds. He has no wheezes. He has no rhonchi. He has no rales.  Abdominal: Soft. Normal appearance and bowel sounds are normal. He exhibits no distension. There is no tenderness. There is no rebound and no CVA tenderness.  Musculoskeletal: Normal range of motion. He exhibits no edema or tenderness.  No pedal edema  Neurological: He is alert and oriented to person, place, and time. He has normal strength. No cranial nerve deficit or sensory deficit. GCS eye subscore is 4. GCS verbal subscore is 5. GCS motor subscore is 6.  Skin: Skin is warm and dry. No abrasion and no rash noted.  Psychiatric: He has a normal mood and affect. His speech is normal and behavior is normal.  Nursing note and vitals reviewed.   ED Course  Procedures (including critical care time) Labs Review Labs Reviewed  CBC  TROPONIN I  COMPREHENSIVE METABOLIC PANEL  BRAIN NATRIURETIC PEPTIDE  I-STAT CG4 LACTIC ACID, ED    Imaging Review No results found.   EKG Interpretation   Date/Time:  Friday June 19 2014 16:57:14 EDT Ventricular Rate:  104 PR Interval:  140 QRS Duration: 149 QT Interval:  376 QTC Calculation: 495 R Axis:   -67 Text Interpretation:  Sinus tachycardia Atrial premature complexes RBBB  and LAFB Baseline wander in lead(s) II III aVF Poor data quality Confirmed  by Aerielle Stoklosa  MD, Destry Bezdek ([id number]) on 06/19/2014 5:25:03 PM      MDM   Final diagnoses:  None   Patient mildly hypotensive on arrival here and responded with IV fluids. Old records reviewed extensively. Patient without active chest pain at  this time. Chest x-ray reviewed as well as his lactate. Due to his history of diastolic dysfunction and blood pressure stabilizing he was not given a 30 mL/kg bolus of saline. Patient's x-ray with possible infection. He is mentating appropriately. Started on IV and probiotics and blood cultures obtained. Repeat lactate ordered. Troponin elevation of his well 2. Mildly elevated a BNP noted as well 2. Consultative cardiology who has seen the patient and recommended admission to the medicine service. Patient will be admitted to step down. Family notified via the interpreter phone  CRITICAL CARE Performed by: Toy Baker Total critical care time: 60 Critical care time was exclusive of separately billable procedures and treating other patients. Critical care was necessary to  treat or prevent imminent or life-threatening deterioration. Critical care was time spent personally by me on the following activities: development of treatment plan with patient and/or surrogate as well as nursing, discussions with consultants, evaluation of patient's response to treatment, examination of patient, obtaining history from patient or surrogate, ordering and performing treatments and interventions, ordering and review of laboratory studies, ordering and review of radiographic studies, pulse oximetry and re-evaluation of patient's condition.     Lorre Nick, MD 06/19/14 2059

## 2014-06-19 NOTE — Progress Notes (Signed)
ANTIBIOTIC CONSULT NOTE - INITIAL  Pharmacy Consult for ceftriaxone, azithromcyin Indication: pneumonia  No Known Allergies  Patient Measurements:   Adjusted Body Weight:   Vital Signs: Temp: 98.6 F (37 C) (06/17 1703) Temp Source: Oral (06/17 1703) BP: 97/48 mmHg (06/17 1845) Pulse Rate: 121 (06/17 1706) Intake/Output from previous day:   Intake/Output from this shift:    Labs:  Recent Labs  06/19/14 1725  CREATININE 3.47*   CrCl cannot be calculated (Unknown ideal weight.). No results for input(s): VANCOTROUGH, VANCOPEAK, VANCORANDOM, GENTTROUGH, GENTPEAK, GENTRANDOM, TOBRATROUGH, TOBRAPEAK, TOBRARND, AMIKACINPEAK, AMIKACINTROU, AMIKACIN in the last 72 hours.   Microbiology: No results found for this or any previous visit (from the past 720 hour(s)).  Medical History: Past Medical History  Diagnosis Date  . CHF (congestive heart failure)     secondary to diastolic dysfunction; last echo in 2009. Normal EF.   Marland Kitchen History of diastolic dysfunction   . Coronary artery disease     s/p CABG in 2001 with LIMA to OM, SVG to DX, SVG to prox & distal PD  . CKD (chronic kidney disease) stage 4, GFR 15-29 ml/min   . Hypertension   . Pulmonary hypertension   . Hyperlipidemia   . Chronic atrial fibrillation   . SBO (small bowel obstruction)     PARTIAL  . Chronic anticoagulation     stopped coumadin due to profound anemia 05/02/2011  . Anemia     Refuses GI workup  . Weight loss     Medications:   (Not in a hospital admission)   Assessment: Admitted with chest pain, SCr up to 3.5, eCrCl ~15 ml/min. LA 2.8, no leukocytosis, afebrile, soft BP, slightly tachy. Initiating abx for possible CAP.  Goal of Therapy:  Resolution of infection  Plan:  -Azithromycin 500 mg IV q24h -Ceftriaxone 1 g IV q24h -Monitor cultures, duration of therapy, change to PO as able   Agapito Games, PharmD, BCPS Clinical Pharmacist Pager: [phone] 06/19/2014 8:29 PM

## 2014-06-19 NOTE — ED Notes (Signed)
Pt undressed, in gown, on monitor, continuous pulse oximetry and blood pressure cuff; wife at beside; per wife pt does not speak or understand any [place] and wife speaks only a little [place]

## 2014-06-19 NOTE — ED Notes (Signed)
Per EMS: pt from home for eval of non radiating cp x2 days, pt denies any n/v/ or diarrhea. Pt also reports no appetite today, unable to obtain full history due to communication barrier and pt being hearing impaired. Pt also speaks [place] . nad noted upon arrival at this time.

## 2014-06-20 ENCOUNTER — Encounter (HOSPITAL_COMMUNITY): Payer: Self-pay | Admitting: *Deleted

## 2014-06-20 DIAGNOSIS — J690 Pneumonitis due to inhalation of food and vomit: Secondary | ICD-10-CM

## 2014-06-20 DIAGNOSIS — R778 Other specified abnormalities of plasma proteins: Secondary | ICD-10-CM | POA: Diagnosis present

## 2014-06-20 DIAGNOSIS — A419 Sepsis, unspecified organism: Principal | ICD-10-CM | POA: Diagnosis present

## 2014-06-20 DIAGNOSIS — I482 Chronic atrial fibrillation: Secondary | ICD-10-CM

## 2014-06-20 DIAGNOSIS — I25119 Atherosclerotic heart disease of native coronary artery with unspecified angina pectoris: Secondary | ICD-10-CM

## 2014-06-20 DIAGNOSIS — L8990 Pressure ulcer of unspecified site, unspecified stage: Secondary | ICD-10-CM | POA: Diagnosis present

## 2014-06-20 DIAGNOSIS — N179 Acute kidney failure, unspecified: Secondary | ICD-10-CM

## 2014-06-20 DIAGNOSIS — N184 Chronic kidney disease, stage 4 (severe): Secondary | ICD-10-CM

## 2014-06-20 DIAGNOSIS — I959 Hypotension, unspecified: Secondary | ICD-10-CM | POA: Diagnosis present

## 2014-06-20 DIAGNOSIS — J189 Pneumonia, unspecified organism: Secondary | ICD-10-CM

## 2014-06-20 DIAGNOSIS — R7989 Other specified abnormal findings of blood chemistry: Secondary | ICD-10-CM

## 2014-06-20 DIAGNOSIS — I9589 Other hypotension: Secondary | ICD-10-CM

## 2014-06-20 DIAGNOSIS — E43 Unspecified severe protein-calorie malnutrition: Secondary | ICD-10-CM | POA: Insufficient documentation

## 2014-06-20 LAB — BASIC METABOLIC PANEL
Anion gap: 10 (ref 5–15)
BUN: 66 mg/dL — AB (ref 6–20)
CALCIUM: 7.8 mg/dL — AB (ref 8.9–10.3)
CO2: 32 mmol/L (ref 22–32)
Chloride: 98 mmol/L — ABNORMAL LOW (ref 101–111)
Creatinine, Ser: 3.36 mg/dL — ABNORMAL HIGH (ref 0.61–1.24)
GFR calc Af Amer: 18 mL/min — ABNORMAL LOW (ref >60)
GFR calc non Af Amer: 15 mL/min — ABNORMAL LOW (ref >60)
Glucose, Bld: 91 mg/dL (ref 65–99)
POTASSIUM: 4.2 mmol/L (ref 3.5–5.1)
Sodium: 140 mmol/L (ref 135–145)

## 2014-06-20 LAB — TROPONIN I
Troponin I: 0.17 ng/mL — ABNORMAL HIGH (ref <0.031)
Troponin I: 0.18 ng/mL — ABNORMAL HIGH (ref <0.031)
Troponin I: 0.16 ng/mL — ABNORMAL HIGH (ref <0.031)

## 2014-06-20 LAB — MRSA PCR SCREENING: MRSA BY PCR: NEGATIVE

## 2014-06-20 LAB — CBC
HEMATOCRIT: 34.2 % — AB (ref 39.0–52.0)
Hemoglobin: 10.6 g/dL — ABNORMAL LOW (ref 13.0–17.0)
MCH: 31.6 pg (ref 26.0–34.0)
MCHC: 31 g/dL (ref 30.0–36.0)
MCV: 102.1 fL — ABNORMAL HIGH (ref 78.0–100.0)
PLATELETS: 133 10*3/uL — AB (ref 150–400)
RBC: 3.35 MIL/uL — ABNORMAL LOW (ref 4.22–5.81)
RDW: 14.3 % (ref 11.5–15.5)
WBC: 9.7 10*3/uL (ref 4.0–10.5)

## 2014-06-20 LAB — LACTIC ACID, PLASMA
Lactic Acid, Venous: 1.7 mmol/L (ref 0.5–2.0)
Lactic Acid, Venous: 1.6 mmol/L (ref 0.5–2.0)

## 2014-06-20 LAB — URINALYSIS, ROUTINE W REFLEX MICROSCOPIC
GLUCOSE, UA: NEGATIVE mg/dL
Hgb urine dipstick: NEGATIVE
KETONES UR: NEGATIVE mg/dL
LEUKOCYTES UA: NEGATIVE
Nitrite: NEGATIVE
PROTEIN: NEGATIVE mg/dL
Specific Gravity, Urine: 1.017 (ref 1.005–1.030)
Urobilinogen, UA: 1 mg/dL (ref 0.0–1.0)
pH: 5 (ref 5.0–8.0)

## 2014-06-20 LAB — STREP PNEUMONIAE URINARY ANTIGEN: Strep Pneumo Urinary Antigen: NEGATIVE

## 2014-06-20 MED ORDER — VANCOMYCIN HCL IN DEXTROSE 750-5 MG/150ML-% IV SOLN
750.0000 mg | INTRAVENOUS | Status: DC
  Administered 2014-06-20 – 2014-06-22 (×2): 750 mg via INTRAVENOUS
  Filled 2014-06-20 (×3): qty 150

## 2014-06-20 MED ORDER — SODIUM CHLORIDE 0.9 % IV BOLUS (SEPSIS)
1000.0000 mL | INTRAVENOUS | Status: DC | PRN
  Administered 2014-06-20 (×2): 1000 mL via INTRAVENOUS
  Filled 2014-06-20: qty 1000

## 2014-06-20 MED ORDER — SODIUM CHLORIDE 0.9 % IV BOLUS (SEPSIS)
500.0000 mL | Freq: Once | INTRAVENOUS | Status: AC
  Administered 2014-06-20: 500 mL via INTRAVENOUS

## 2014-06-20 MED ORDER — ACETAMINOPHEN 650 MG RE SUPP
650.0000 mg | RECTAL | Status: DC | PRN
  Administered 2014-06-20: 650 mg via RECTAL
  Filled 2014-06-20: qty 1

## 2014-06-20 MED ORDER — ACETAMINOPHEN 325 MG PO TABS: 650.0000 mg | ORAL_TABLET | ORAL | Status: DC | PRN

## 2014-06-20 MED ORDER — CETYLPYRIDINIUM CHLORIDE 0.05 % MT LIQD
7.0000 mL | Freq: Two times a day (BID) | OROMUCOSAL | Status: DC
  Administered 2014-06-20 – 2014-06-21 (×3): 7 mL via OROMUCOSAL

## 2014-06-20 MED ORDER — CHLORHEXIDINE GLUCONATE 0.12 % MT SOLN
15.0000 mL | Freq: Two times a day (BID) | OROMUCOSAL | Status: DC
  Administered 2014-06-20 – 2014-06-24 (×4): 15 mL via OROMUCOSAL
  Filled 2014-06-20 (×12): qty 15

## 2014-06-20 MED ORDER — ASPIRIN 300 MG RE SUPP
300.0000 mg | Freq: Every day | RECTAL | Status: DC
  Administered 2014-06-20 – 2014-06-22 (×3): 300 mg via RECTAL
  Filled 2014-06-20 (×5): qty 1

## 2014-06-20 MED ORDER — DEXTROSE-NACL 5-0.9 % IV SOLN
INTRAVENOUS | Status: DC
  Administered 2014-06-20 – 2014-06-22 (×4): via INTRAVENOUS

## 2014-06-20 MED ORDER — LORAZEPAM 2 MG/ML IJ SOLN
0.5000 mg | Freq: Four times a day (QID) | INTRAMUSCULAR | Status: DC | PRN
  Administered 2014-06-20 – 2014-06-21 (×3): 0.5 mg via INTRAVENOUS
  Filled 2014-06-20 (×4): qty 1

## 2014-06-20 MED ORDER — PIPERACILLIN-TAZOBACTAM IN DEX 2-0.25 GM/50ML IV SOLN
2.2500 g | Freq: Three times a day (TID) | INTRAVENOUS | Status: DC
  Administered 2014-06-20 – 2014-06-24 (×11): 2.25 g via INTRAVENOUS
  Filled 2014-06-20 (×17): qty 50

## 2014-06-20 NOTE — Progress Notes (Addendum)
ANTIBIOTIC CONSULT NOTE - INITIAL  Pharmacy Consult for Vancomycin Indication: pneumonia  No Known Allergies  Patient Measurements: Height: 6' (182.9 cm) Weight: 114 lb 13.8 oz (52.1 kg) IBW/kg (Calculated) : 77.6  Vital Signs: Temp: 98.6 F (37 C) (06/18 0348) Temp Source: Oral (06/18 0348) BP: 81/62 mmHg (06/18 0600) Pulse Rate: 91 (06/18 0600) Intake/Output from previous day: 06/17 0701 - 06/18 0700 In: 2275 [I.V.:525; IV Piggyback:1750] Out: 10 [Urine:10] Intake/Output from this shift:    Labs:  Recent Labs  06/19/14 1725 06/19/14 2315 06/20/14 0246  WBC  --  9.0 9.7  HGB  --  10.5* 10.6*  PLT  --  143* 133*  CREATININE 3.47*  --  3.36*   Estimated Creatinine Clearance: 11.4 mL/min (by C-G formula based on Cr of 3.36). No results for input(s): VANCOTROUGH, VANCOPEAK, VANCORANDOM, GENTTROUGH, GENTPEAK, GENTRANDOM, TOBRATROUGH, TOBRAPEAK, TOBRARND, AMIKACINPEAK, AMIKACINTROU, AMIKACIN in the last 72 hours.   Microbiology: Recent Results (from the past 720 hour(s))  MRSA PCR Screening     Status: None   Collection Time: 06/19/14 11:19 PM  Result Value Ref Range Status   MRSA by PCR NEGATIVE NEGATIVE Final    Comment:        The GeneXpert MRSA Assay (FDA approved for NASAL specimens only), is one component of a comprehensive MRSA colonization surveillance program. It is not intended to diagnose MRSA infection nor to guide or monitor treatment for MRSA infections.     Medical History: Past Medical History  Diagnosis Date  . CHF (congestive heart failure)     secondary to diastolic dysfunction; last echo in 2009. Normal EF.   Marland Kitchen History of diastolic dysfunction   . Coronary artery disease     s/p CABG in 2001 with LIMA to OM, SVG to DX, SVG to prox & distal PD  . CKD (chronic kidney disease) stage 4, GFR 15-29 ml/min   . Hypertension   . Pulmonary hypertension   . Hyperlipidemia   . Chronic atrial fibrillation   . SBO (small bowel obstruction)    PARTIAL  . Chronic anticoagulation     stopped coumadin due to profound anemia 05/02/2011  . Anemia     Refuses GI workup  . Weight loss   . Dysrhythmia   . Pneumonia     Medications:  Scheduled:  . antiseptic oral rinse  7 mL Mouth Rinse q12n4p  . azithromycin  500 mg Intravenous Q24H  . cefTRIAXone (ROCEPHIN)  IV  1 g Intravenous Q24H  . chlorhexidine  15 mL Mouth Rinse BID  . enoxaparin (LOVENOX) injection  30 mg Subcutaneous Q24H   Infusions:  . sodium chloride 75 mL/hr at 06/20/14 0209   Assessment: CC: Daijon Yanik is an 81 y.o. male admitted on 06/19/2014 presenting with chest congestion, cough, and chest pain.  CXR revealed LLL infiltrate, patient worked up for sepsis.  Patient was initiated on IV rocephin and azithromycin.  Pharmacy has been consulted to dose vancomycin & zosyn in the setting of possible pneumonia.    6/18 Vanc >> 6/18 Zosyn >> 6/17 Azith >> 6/18 6/17 CTX >> 6/18  6/18 UCx >> 6/17 BCx x 2 >>  WBC wnl, LA 1.07, afeb Patient AoCKD, Creat 3.36, CrCl ~10-15 mL/min  Goal of Therapy:  Vancomycin trough level 15-20 mcg/ml  Plan:  - Vancomycin 750 mg IV Q48H - Zosyn 2.25 g IV Q8H - Monitor renal function, clinical efficacy, cultures, and trough levels when appropriate  Red Christians, Pharm. D. Clinical Pharmacy Resident  Pager: [phone] Ph: [phone] 06/20/2014 9:11 AM

## 2014-06-20 NOTE — Evaluation (Signed)
Clinical/Bedside Swallow Evaluation Patient Details  Name: Blake Arias MRN: [id number] Date of Birth: [birth year]  Today's Date: 06/20/2014 Time: SLP Start Time (ACUTE ONLY): 1245 SLP Stop Time (ACUTE ONLY): 1319 SLP Time Calculation (min) (ACUTE ONLY): 34 min  Past Medical History:  Past Medical History  Diagnosis Date  . CHF (congestive heart failure)     secondary to diastolic dysfunction; last echo in 2009. Normal EF.   Marland Kitchen History of diastolic dysfunction   . Coronary artery disease     s/p CABG in 2001 with LIMA to OM, SVG to DX, SVG to prox & distal PD  . CKD (chronic kidney disease) stage 4, GFR 15-29 ml/min   . Hypertension   . Pulmonary hypertension   . Hyperlipidemia   . Chronic atrial fibrillation   . SBO (small bowel obstruction)     PARTIAL  . Chronic anticoagulation     stopped coumadin due to profound anemia 05/02/2011  . Anemia     Refuses GI workup  . Weight loss   . Dysrhythmia   . Pneumonia    Past Surgical History:  Past Surgical History  Procedure Laterality Date  . Cardiac catheterization  03/31/1999    EF 70%  . Cholecystectomy    . External ear surgery    . Coronary artery bypass graft  2001    LIMA GRAFT TO THE OBTUSE MARGINAL VESSEL, SAPHENOUS VEIN GRAFT TO THE DIAGONAL , SAPHENOUS VEIN GRAFT TO THE PROXIMAL AND DISTAL PDA, AND SAPHENOUS VEIN GRAFT TO THE POSTERIOR LATERAL BRANCH THE RIGHT CORONARY  . Transthoracic echocardiogram  12/17/2007    EF 55-60%  . US echocardiography  05/31/2007    EF 55-60%  . US echocardiography  09/21/2004    EF 55-60%  . Cardiovascular stress test  09/22/2004    EF 53%   HPI:  Blake Arias is a 81 y.o. male from [place] with a history of CAD S/P CABG, Chronic Atrial Fibrillation, CHF, HTN, Hyperlipidemia, and Stage IV CKD who was brought to the ED due to complaints of Chest congestion, Cough and chest pain worsening over 3 days. He has had increased weakness and poor appetite as well .He was found to have a LLL  Infiltrate on chest X-ray. Patient does not speak [place], primarly language [place].   Assessment / Plan / Recommendation Clinical Impression  Bedside swallow examination attempted with use of intrepretor via telephone. Pt appearing confused and agitated. Limited PO trials observed this date. Ice chips and thin liquid  both tested with pt displayed severe delay in swallow intitation, up to 20 seconds per bolus. Coughing noted following thin liquid via cup and change in patients heart rate and respiration. Attempted other consistencies which pt refused. Pts spouse present near end of session, who also was unsuccessful in convincing patient to consume additional PO. Continue NPO with meds via alternative means. ST will continue to follow for determination of safest diet. Recommend temporary means of alternative for adequate nutritional support.     Aspiration Risk  Moderate    Diet Recommendation NPO   Medication Administration: Via alternative means    Other  Recommendations Oral Care Recommendations: Oral care QID   Follow Up Recommendations       Frequency and Duration min 2x/week  2 weeks   Pertinent Vitals/Pain     SLP Swallow Goals     Swallow Study Prior Functional Status       General Other Pertinent Information: Blake Arias is a 81 y.o. male from  [place] with a history of CAD S/P CABG, Chronic Atrial Fibrillation, CHF, HTN, Hyperlipidemia, and Stage IV CKD who was brought to the ED due to complaints of Chest congestion, Cough and chest pain worsening over 3 days. He has had increased weakness and poor appetite as well .He was found to have a LLL Infiltrate on chest X-ray. Patient does not speak [place], primarly language [place]. Type of Study: Bedside swallow evaluation Diet Prior to this Study: NPO Temperature Spikes Noted: No Respiratory Status: Supplemental O2 delivered via (comment) History of Recent Intubation: No Behavior/Cognition:  Cooperative;Agitated;Fusing/Irritable Oral Cavity - Dentition: Edentulous Self-Feeding Abilities: Needs assist;Able to feed self Patient Positioning: Upright in bed Baseline Vocal Quality: Low vocal intensity Volitional Cough: Cognitively unable to elicit Volitional Swallow: Unable to elicit    Oral/Motor/Sensory Function Overall Oral Motor/Sensory Function: Impaired Labial Strength: Reduced Lingual Strength: Reduced   Ice Chips Ice chips: Impaired Presentation: Spoon Oral Phase Impairments: Impaired anterior to posterior transit;Reduced lingual movement/coordination Oral Phase Functional Implications: Oral residue;Oral holding;Prolonged oral transit Pharyngeal Phase Impairments: Change in Vital Signs;Suspected delayed Swallow   Thin Liquid Thin Liquid: Impaired Presentation: Cup Oral Phase Impairments: Reduced lingual movement/coordination;Impaired anterior to posterior transit Oral Phase Functional Implications: Prolonged oral transit;Oral holding Pharyngeal  Phase Impairments: Suspected delayed Swallow;Cough - Delayed;Wet Vocal Quality;Change in Vital Signs    Nectar Thick Nectar Thick Liquid: Not tested (pt refused further PO )   Honey Thick Honey Thick Liquid: Not tested (pt refused )   Puree Puree: Not tested (pt refused)   Solid   GO    Marcene Duos MA, CCC-SLP Acute Care Speech Language Pathologist     Solid: Not tested       Marcene Duos E 06/20/2014,1:25 PM

## 2014-06-20 NOTE — Progress Notes (Signed)
Patient ID: Blake Arias, male   DOB: 25-Apr-1924, 81 y.o.   MRN: [id number]    Patient Name: Blake Arias Date of Encounter: 06/20/2014     Principal Problem:   Sepsis Active Problems:   HYPERCHOLESTEROLEMIA   Coronary atherosclerosis   Chronic atrial fibrillation   CKD (chronic kidney disease) stage 4, GFR 15-29 ml/min   CAP (community acquired pneumonia)   Hypotension   AKI (acute kidney injury)   Elevated troponin   Pressure ulcer    SUBJECTIVE  Via interpreter (wife) he c/o feeling nervous. No chest pain or sob.   CURRENT MEDS . antiseptic oral rinse  7 mL Mouth Rinse q12n4p  . azithromycin  500 mg Intravenous Q24H  . cefTRIAXone (ROCEPHIN)  IV  1 g Intravenous Q24H  . chlorhexidine  15 mL Mouth Rinse BID  . enoxaparin (LOVENOX) injection  30 mg Subcutaneous Q24H  . vancomycin  750 mg Intravenous Q48H    OBJECTIVE  Filed Vitals:   06/20/14 0600 06/20/14 0710 06/20/14 0800 06/20/14 0900  BP: 81/62 115/54 96/55 97/61   Pulse: 91 90    Temp:      TempSrc:      Resp: 19 20    Height:      Weight:      SpO2: 100% 94%      Intake/Output Summary (Last 24 hours) at 06/20/14 1053 Last data filed at 06/20/14 0600  Gross per 24 hour  Intake   2275 ml  Output     10 ml  Net   2265 ml   Filed Weights   06/19/14 2300  Weight: 114 lb 13.8 oz (52.1 kg)    PHYSICAL EXAM  General: Pleasant, diskempt cachectic, NAD. Neuro: Alert and oriented X 3. Moves all extremities spontaneously. Psych: Normal affect. HEENT:  Normal  Neck: Supple without bruits or JVD. Lungs:  Resp regular and unlabored, CTA. Heart: RRR no s3, s4, or murmurs. Abdomen: Soft, non-tender, non-distended, BS + x 4.  Extremities: No clubbing, cyanosis or edema. DP/PT/Radials 2+ and equal bilaterally.  Accessory Clinical Findings  CBC  Recent Labs  06/19/14 2315 06/20/14 0246  WBC 9.0 9.7  HGB 10.5* 10.6*  HCT 33.7* 34.2*  MCV 101.8* 102.1*  PLT 143* 133*   Basic Metabolic  Panel  Recent Labs  40/98/11 1725 06/20/14 0246  NA 139 140  K 4.1 4.2  CL 94* 98*  CO2 32 32  GLUCOSE 99 91  BUN 63* 66*  CREATININE 3.47* 3.36*  CALCIUM 8.3* 7.8*   Liver Function Tests  Recent Labs  06/19/14 1725  AST 38  ALT 19  ALKPHOS 64  BILITOT 1.9*  PROT 6.2*  ALBUMIN 2.6*   No results for input(s): LIPASE, AMYLASE in the last 72 hours. Cardiac Enzymes  Recent Labs  06/19/14 1725  TROPONINI 0.19*   BNP Invalid input(s): POCBNP D-Dimer No results for input(s): DDIMER in the last 72 hours. Hemoglobin A1C No results for input(s): HGBA1C in the last 72 hours. Fasting Lipid Panel No results for input(s): CHOL, HDL, LDLCALC, TRIG, CHOLHDL, LDLDIRECT in the last 72 hours. Thyroid Function Tests No results for input(s): TSH, T4TOTAL, T3FREE, THYROIDAB in the last 72 hours.  Invalid input(s): FREET3  TELE  Atrial fib with a RVR  Radiology/Studies  Dg Chest Port 1 View  06/19/2014   CLINICAL DATA:  Chest pain and cough with congestion  EXAM: PORTABLE CHEST - 1 VIEW  COMPARISON:  06/11/2009  FINDINGS: Moderate enlargement of cardiac silhouette is reidentified  with evidence of prior median sternotomy. Dense left lower lobe consolidation is present. Small left pleural effusion. Right lung is grossly clear but detail is obscured by overlying cardiac leads. No acute osseous abnormality.  IMPRESSION: Dense left lower lobe consolidation which could indicate pneumonia but would be best evaluated at PA and lateral chest radiographs when the patient is clinically able.   Electronically Signed   By: Christiana Pellant M.D.   On: 06/19/2014 18:48    ASSESSMENT AND PLAN  1. Acute on chronic systolic heart failure 2. Chest pain 3. Anxiety 4. Benzodiazipine dependence Rec: he appears mostly euvolemic and is not having chest pain. He is quiet anxious and his wife corroborates this. Will restart Lorazepam.  Gregg Taylor,M.D.  06/20/2014 10:53 AM

## 2014-06-20 NOTE — Progress Notes (Signed)
Interpreter line used on multiple occasions throughout the day.  Used interpreter line to communicate with wife. Per interpreter- Patient lives with wife and son who has PTSD. Patient- Blake Arias, has not acted like himself since the night of 6/17. He is typically independent, can dress, feed himself. Since 6/17 he is more demanding, yelling, doesn't listen to reason, is unable to walk. Per wife, this is not how her husband typically behaves. Wife became upset and starting crying while on phone with interpreter as she explained her situation. Wife has no support system close by, [place] her son in [place]. Patients wife was educated regarding patients current condition and the need for antibiotics, wife verbalized understanding.

## 2014-06-20 NOTE — Progress Notes (Signed)
Patient transferred from ER via stretcher on tele by ER RN. Patient's wife at bedside. Patient and wife speak [place]. Used interpreter phone to educate patient and his wife and the unit, room, and instructed on callbell. Callbell placed at side. Educated about high fall risk, bed alarm on. Patient and family updated on POC, no further questions. Patient belongings at bedside. Will continue to monitor.

## 2014-06-20 NOTE — Progress Notes (Addendum)
Initial Nutrition Assessment  DOCUMENTATION CODES: Underweight, Severe malnutrition in context of chronic illness  INTERVENTION: Pt appears extremely malnourished. Recommend initiating TF if he is deemed unsafe for PO intake. Given his long hx of poor intake, he is at high risk for refeeding syndrome. Monitor Mag/Phos  Recommend Vital AF 1.5  @ 20 ml/hr via NGT and increase by 10 ml every 12 hours to goal rate of 45 ml/hr.   30 ml Prostat Daily.    Tube feeding regimen provides 1720 kcal (100% of needs), 88 grams of protein, and 825 ml of H2O.    NUTRITION DIAGNOSIS: Inadequate oral intake related to dysphagia/difficulty chewing as evidenced by loss of 10% bw in <2 months  GOAL: Oral intake vs Tube feeding <24 hours Patient will meet greater than or equal to 90% of their needs  MONITOR: Labs, swallow function, TF vs Diet advancement, PO intake, skin, I/Os,   REASON FOR ASSESSMENT: Malnutrition Screening Tool   ASSESSMENT: 81 y.o. male from [place]. PMHx: CAD, A-FIB, CHF, HTN, Hyperlipidemia, and Stage IV CKD who was brought to the ED due to complaints of Chest congestion, Cough,  chest pain, weakness and poor PO intake  Spoke with wife through use of translator phone. She reports that the reason pt has had a very poor intake is because he is unable to chew/swallow his food. He has had a progressive decline in his intake for the past 2 years. These past two weeks he has had very little. She reports that one of the few things he consumed is Sprite. Pt did not try any oral supplements-he does not like these.  As of now wife says patient is hungry and is mad that he is unable to have food. She also believes he is very anxious.   NFPE reveals severe muscle/fat wasting.  Labs reviewed: Anemic, Hypochloridemia, hypocalcemia, poor renal function  Height: Ht Readings from Last 1 Encounters:  06/19/14 6' (1.829 m)    Weight: Wt Readings from Last 1 Encounters:  06/19/14 114 lb 13.8  oz (52.1 kg)    Ideal Body Weight:  80.9 kg  Wt Readings from Last 10 Encounters:  06/19/14 114 lb 13.8 oz (52.1 kg)  04/29/14 127 lb 9.6 oz (57.879 kg)  10/10/13 138 lb 7 oz (62.795 kg)  04/07/13 134 lb (60.782 kg)  10/09/12 138 lb (62.596 kg)  03/27/12 146 lb 12.8 oz (66.588 kg)  09/13/11 151 lb 1.9 oz (68.548 kg)  06/13/11 151 lb 12.8 oz (68.856 kg)  05/09/11 159 lb (72.122 kg)  05/02/11 156 lb (70.761 kg)    BMI:  Body mass index is 15.57 kg/(m^2).  Estimated Nutritional Needs: Kcal:  1700-1850 (33-36 kcal/kg) Protein:  88-104 g (1.7-2 g/kg) Fluid:  1.7-1.9 liters  Skin:  PU stage 2 coccyx  Diet Order:  NPO  EDUCATION NEEDS:  No education needs identified at this time   Intake/Output Summary (Last 24 hours) at 06/20/14 1424 Last data filed at 06/20/14 1300  Gross per 24 hour  Intake   4725 ml  Output     10 ml  Net   4715 ml    Last BM:  6/17  Christophe Louis RD, LDN Nutrition Pager: [phone] 06/20/2014 2:24 PM

## 2014-06-20 NOTE — Progress Notes (Signed)
[facility] PROGRESS NOTE  Blake Arias WUJ:[id number] DOB: [birth year] DOA: 06/19/2014 PCP: Dorrene German, MD  History of pain from wife at bedside with the help of Bosnian interpreter.  Brief narrative 81 year old [place] male with history of CAD status post CABG, chronic A. fib not on anticoagulation due to history of GI bleed, CHF, hypertension, hyperlipidemia, stage IV CK D brought to the ED by his wife for chest pain for the past 3 days. Patient also has extremely poor appetite for past several days with failure to thrive and increased weakness to the point that he has not been getting out of bed for the last few days. Patient was found to be septic in the ED with a large left lower lobe infiltrate , hypotensive and tachycardic in rapid A. Fib. Patient also found to have acute on chronic kidney disease and elevated troponin. Patient admitted to stepdown unit for sepsis secondary to aspiration/left lobar pneumonia with NSTEMI.  Assessment/Plan: Sepsis secondary to aspiration/left lower pneumonia with bacteremia Continue step down monitoring. Will broaden antibiotic coverage to vancomycin and Zosyn (patient already on vancomycin, Rocephin and azithromycin on admission) -Blood culture growing gram positive cocci. Will follow with sensitivity. Next line-keep nothing by mouth. Failed swallow evaluation today as patient was agitated. Monitor with fluids. -We'll ask to reassess in next 1-2 days. -Repeat chest x-ray in a.m. if stable. -Aspiration precautions.  Hypotension Secondary to underlying sepsis. Receiving when necessary IV normal saline bolus and maintenance fluid. No signs of volume overload.  NSTEMI Patient presented with chest pain with underlying CAD with CABG. No further chest pain symptoms. Troponin peaked at 0.19. Could be demand ischemia from underlying sepsis. No EKG changes except for rapid A. Fib. Continue full dose aspirin. Holding beta blocker due to hypotension.   Patient is not a candidate for any intervention at this time. Check 2-D echo. Appreciate cardiology evaluation. Sees Dr. [place].  Atrial fibrillation Monitor on aspirin. Not on anticoagulation given history of GI bleed on anticoagulation. Check 2-D echo.  Anxiety Place on IV Ativan when necessary  Failure to thrive with severe protein calorie malnutrition Patient has poor by mouth intake for past several days with significant weight loss as per wife. Has severe deconditioning. If continues to fail swallow evaluation will need to discuss alternative means of feeding. No signs of clinical improvement or progressive decline will also need to discuss goals of care with the wife and I think we might be heading in that direction.  Acute on chronic kidney disease stage III Likely worsened by underlying sepsis and prerenal with dehydration. Monitor with IV fluids. Avoid nephrotoxic.  DVT prophylaxis: Subcutaneous heparin  Diet: Nothing by mouth  Code Status: Full code Family Communication: Wife at bedside Disposition Plan: Continue step down monitoring. Prognosis is guarded.   Consultants:  Audiology  Procedures:  2-D echo  Antibiotics:   IV vancomycin 6/17--  IV zosyn 6/18--  IV rocephin & azithromycin x1  HPI/Subjective: Patient seen and examined. History obtained with the help of Bosnian interpreter on the phone with wife at bedside. Ejection a hard of hearing and unable to provide any history. Admission H&P reviewed. Patient having low blood pressure requiring aortic IV normal saline bolus.  Objective: Filed Vitals:   06/20/14 0600  BP: 81/62  Pulse: 91  Temp:   Resp: 19    Intake/Output Summary (Last 24 hours) at 06/20/14 0913 Last data filed at 06/20/14 0600  Gross per 24 hour  Intake   2275 ml  Output  10 ml  Net   2265 ml   Filed Weights   06/19/14 2300  Weight: 52.1 kg (114 lb 13.8 oz)    Exam:   General:  Elderly cachectic male lying in bed  in no acute distress, hard of hearing  HEENT: Temporal wasting, no pallor, dry oral mucosa, supple neck, no JVD  Chest: Diminished left-sided breath sounds, no rhonchi or wheeze  CVS: S1 and S2 irregular, no murmurs rub or gallop  GI: Soft, nondistended, nontender, bowel sounds present  Musculoskeletal: Warm, no edema, superficial pressure ulcer over the sacrum  CNS: Alert and oriented but irritable   Data Reviewed: Basic Metabolic Panel:  Recent Labs Lab 06/19/14 1725 06/20/14 0246  NA 139 140  K 4.1 4.2  CL 94* 98*  CO2 32 32  GLUCOSE 99 91  BUN 63* 66*  CREATININE 3.47* 3.36*  CALCIUM 8.3* 7.8*   Liver Function Tests:  Recent Labs Lab 06/19/14 1725  AST 38  ALT 19  ALKPHOS 64  BILITOT 1.9*  PROT 6.2*  ALBUMIN 2.6*   No results for input(s): LIPASE, AMYLASE in the last 168 hours. No results for input(s): AMMONIA in the last 168 hours. CBC:  Recent Labs Lab 06/19/14 2315 06/20/14 0246  WBC 9.0 9.7  HGB 10.5* 10.6*  HCT 33.7* 34.2*  MCV 101.8* 102.1*  PLT 143* 133*   Cardiac Enzymes:  Recent Labs Lab 06/19/14 1725  TROPONINI 0.19*   BNP (last 3 results)  Recent Labs  06/19/14 1725  BNP 666.4*    ProBNP (last 3 results) No results for input(s): PROBNP in the last 8760 hours.  CBG: No results for input(s): GLUCAP in the last 168 hours.  Recent Results (from the past 240 hour(s))  MRSA PCR Screening     Status: None   Collection Time: 06/19/14 11:19 PM  Result Value Ref Range Status   MRSA by PCR NEGATIVE NEGATIVE Final    Comment:        The GeneXpert MRSA Assay (FDA approved for NASAL specimens only), is one component of a comprehensive MRSA colonization surveillance program. It is not intended to diagnose MRSA infection nor to guide or monitor treatment for MRSA infections.      Studies: Dg Chest Port 1 View  06/19/2014   CLINICAL DATA:  Chest pain and cough with congestion  EXAM: PORTABLE CHEST - 1 VIEW  COMPARISON:   06/11/2009  FINDINGS: Moderate enlargement of cardiac silhouette is reidentified with evidence of prior median sternotomy. Dense left lower lobe consolidation is present. Small left pleural effusion. Right lung is grossly clear but detail is obscured by overlying cardiac leads. No acute osseous abnormality.  IMPRESSION: Dense left lower lobe consolidation which could indicate pneumonia but would be best evaluated at PA and lateral chest radiographs when the patient is clinically able.   Electronically Signed   By: Christiana Pellant M.D.   On: 06/19/2014 18:48    Scheduled Meds: . antiseptic oral rinse  7 mL Mouth Rinse q12n4p  . azithromycin  500 mg Intravenous Q24H  . cefTRIAXone (ROCEPHIN)  IV  1 g Intravenous Q24H  . chlorhexidine  15 mL Mouth Rinse BID  . enoxaparin (LOVENOX) injection  30 mg Subcutaneous Q24H  . vancomycin  750 mg Intravenous Q48H   Continuous Infusions: . sodium chloride 75 mL/hr at 06/20/14 0209     Time spent: 35 minutes    Kwamane Whack  [facility] Pager [phone]. If 7PM-7AM, please contact night-coverage at [email or web address], password  [email or web address] 06/20/2014, 9:13 AM  LOS: 1 day

## 2014-06-20 NOTE — Progress Notes (Signed)
CRITICAL VALUE ALERT  Critical value received:  Blood cultures: Gram positive cocci in clusters  Date of notification:  06/19/2014  Time of notification:  1405  Critical value read back:Yes.    Nurse who received alert:  Wynona Canes  MD notified (1st page):  Dr. Gonzella Lex  Time of first page:  1407  MD notified (2nd page):  Time of second page:  Responding MD:  Dr. Gonzella Lex  Time MD responded: [phone]

## 2014-06-21 ENCOUNTER — Inpatient Hospital Stay (HOSPITAL_COMMUNITY): Payer: Medicare Other

## 2014-06-21 DIAGNOSIS — E43 Unspecified severe protein-calorie malnutrition: Secondary | ICD-10-CM

## 2014-06-21 LAB — BASIC METABOLIC PANEL
Anion gap: 8 (ref 5–15)
BUN: 71 mg/dL — ABNORMAL HIGH (ref 6–20)
CALCIUM: 8 mg/dL — AB (ref 8.9–10.3)
CHLORIDE: 106 mmol/L (ref 101–111)
CO2: 28 mmol/L (ref 22–32)
CREATININE: 3.58 mg/dL — AB (ref 0.61–1.24)
GFR calc Af Amer: 16 mL/min — ABNORMAL LOW (ref >60)
GFR, EST NON AFRICAN AMERICAN: 14 mL/min — AB (ref >60)
GLUCOSE: 96 mg/dL (ref 65–99)
POTASSIUM: 4 mmol/L (ref 3.5–5.1)
Sodium: 142 mmol/L (ref 135–145)

## 2014-06-21 LAB — CBC
HEMATOCRIT: 35.6 % — AB (ref 39.0–52.0)
Hemoglobin: 10.8 g/dL — ABNORMAL LOW (ref 13.0–17.0)
MCH: 31 pg (ref 26.0–34.0)
MCHC: 30.3 g/dL (ref 30.0–36.0)
MCV: 102.3 fL — ABNORMAL HIGH (ref 78.0–100.0)
Platelets: 151 10*3/uL (ref 150–400)
RBC: 3.48 MIL/uL — AB (ref 4.22–5.81)
RDW: 14.1 % (ref 11.5–15.5)
WBC: 9.6 10*3/uL (ref 4.0–10.5)

## 2014-06-21 LAB — URINE CULTURE

## 2014-06-21 MED ORDER — MORPHINE SULFATE 2 MG/ML IJ SOLN
1.0000 mg | INTRAMUSCULAR | Status: DC | PRN
  Administered 2014-06-21 – 2014-06-24 (×9): 1 mg via INTRAVENOUS
  Filled 2014-06-21 (×10): qty 1

## 2014-06-21 NOTE — Evaluation (Signed)
Physical Therapy Evaluation Patient Details Name: Blake Arias MRN: [id number] DOB: June 04, 1924 Today's Date: 06/21/2014   History of Present Illness  Blake Arias is a 81 y.o. male from [place] with a history of CAD S/P CABG, Chronic Atrial Fibrillation, CHF, HTN, Hyperlipidemia, and Stage IV CKD who was brought to the ED due to complaints of Chest congestion, Cough and chest pain worsening over 3 days.  Work up for sepsis and PNA.  Clinical Impression  Pt admitted with/for weakness and PNA.  Pt currently limited functionally due to the problems listed. ( See problems list.)   Pt will benefit from PT to maximize function and safety in order to get ready for next venue listed below.     Follow Up Recommendations SNF    Equipment Recommendations  Other (comment) (TBA)    Recommendations for Other Services       Precautions / Restrictions Precautions Precautions: Fall      Mobility  Bed Mobility Overal bed mobility: Needs Assistance;+ 2 for safety/equipment Bed Mobility: Supine to Sit;Sit to Supine     Supine to sit: Max assist;+2 for physical assistance;+2 for safety/equipment Sit to supine: Max assist;+2 for physical assistance;+2 for safety/equipment   General bed mobility comments: assist due to weakness and pt's confusion  Transfers Overall transfer level: Modified independent Equipment used: 2 person hand held assist;1 person hand held assist             General transfer comment: assist for guiding and stability  Ambulation/Gait Ambulation/Gait assistance: Mod assist;+2 physical assistance;+2 safety/equipment Ambulation Distance (Feet): 60 Feet Assistive device: 2 person hand held assist Gait Pattern/deviations: Step-through pattern Gait velocity: slower   General Gait Details: assist for stability assist, guiding.  pt was weak kneed and unsteady throughout.  Stairs            Wheelchair Mobility    Modified Rankin (Stroke Patients Only)        Balance Overall balance assessment: Needs assistance Sitting-balance support: Single extremity supported;Bilateral upper extremity supported Sitting balance-Leahy Scale: Poor     Standing balance support: Bilateral upper extremity supported Standing balance-Leahy Scale: Poor                               Pertinent Vitals/Pain Pain Assessment: Faces Faces Pain Scale: No hurt    Home Living Family/patient expects to be discharged to:: Private residence Living Arrangements: Spouse/significant other;Other (Comment) (and son) Available Help at Discharge: Family;Available 24 hours/day             Additional Comments: per chart pt was ambulatory, but unable in the last couple of day PTA.    Prior Function           Comments: No family present to determine full PLOF     Hand Dominance        Extremity/Trunk Assessment   Upper Extremity Assessment: Generalized weakness;Overall WFL for tasks assessed           Lower Extremity Assessment: Generalized weakness         Communication   Communication: Prefers language other than [place];Other (comment) (speaks Bosnian, but interpreter state not often making sense)  Cognition Arousal/Alertness: Awake/alert Behavior During Therapy: Restless;Anxious Overall Cognitive Status: Impaired/Different from baseline                      General Comments General comments (skin integrity, edema, etc.): Per interpreter, pt feels he is  being detained against his will in a mental institution.  He is unable to be convinced otherwise.  He is not making total sense and can not be consoled..  SpO2 dropped into the mid to upper 80's while ambulating on RA, but return quickly on 2L.    Exercises        Assessment/Plan    PT Assessment Patient needs continued PT services  PT Diagnosis Difficulty walking;Generalized weakness   PT Problem List Decreased strength;Decreased activity tolerance;Decreased  balance;Decreased mobility;Decreased knowledge of use of DME;Decreased safety awareness;Decreased knowledge of precautions  PT Treatment Interventions Functional mobility training;Therapeutic activities;Balance training;Patient/family education;DME instruction;Gait training   PT Goals (Current goals can be found in the Care Plan section) Acute Rehab PT Goals Patient Stated Goal: pt unable to participate PT Goal Formulation: Patient unable to participate in goal setting Time For Goal Achievement: 07/05/14 Potential to Achieve Goals: Good    Frequency Min 3X/week   Barriers to discharge        Co-evaluation               End of Session Equipment Utilized During Treatment: Oxygen Activity Tolerance: Patient tolerated treatment well Patient left: in bed;with call bell/phone within reach;Other (comment) (pt adamantly refusing to let mittens be placed on him) Nurse Communication: Mobility status         Time: 8546-2703 PT Time Calculation (min) (ACUTE ONLY): 34 min   Charges:   PT Evaluation $Initial PT Evaluation Tier I: 1 Procedure PT Treatments $Gait Training: 8-22 mins   PT G Codes:        Kathalene Sporer, Eliseo Gum 06/21/2014, 12:10 PM 06/21/2014  Dacula Bing, PT [phone]  (pager)

## 2014-06-21 NOTE — Progress Notes (Signed)
Patient is increasingly anxious.  Calling out for help and reaching into the air.  He is very restless.  Morphine 1 mg given via IV with little response.  Utilized [employer] to help communicate with patient.  Interpreter [phone] had worked with this patient and his family the last 2 days.  He is stating to come "via plane to help him".  He feels "he is going to die".  "If you do not come, I will throw her out".  He also indicated that he wanted his eyeglasses but when I found them and put them on him, he took them off.  Will continue to monitor patient.  [employer] RN-BC, WTA.

## 2014-06-21 NOTE — Progress Notes (Signed)
[facility] PROGRESS NOTE  Blake Arias NOM:[id number] DOB: 06/16/1924 DOA: 06/19/2014 PCP: Dorrene German, MD  Discussed with wife with the help of bosnian  interpreter   Brief narrative 81 year old [place] male with history of CAD status post CABG, chronic A. fib not on anticoagulation due to history of GI bleed, CHF, hypertension, hyperlipidemia, stage IV CK D brought to the ED by his wife for chest pain for the past 3 days. Patient also has extremely poor appetite for past several days with failure to thrive and increased weakness to the point that he has not been getting out of bed for the last few days. Patient was found to be septic in the ED with a large left lower lobe infiltrate , hypotensive and tachycardic in rapid A. Fib. Patient also found to have acute on chronic kidney disease and elevated troponin. Patient admitted to stepdown unit for sepsis secondary to aspiration/left lobar pneumonia with NSTEMI.  Assessment/Plan: Sepsis secondary to aspiration/left lower pneumonia with bacteremia -on empriric vancomycin and Zosyn. -1/2 blood cx growing coagulase negative staph, possibly a contaminant. -Prognosis is extremely guarded. Patient is severely deconditioned, malnourished with dysphagia. He is agitated and confused as well. -Discussed poor prognosis with wife with the help of the interpreter. She understands patient's progressive decline in symptoms. She does not wish any aggressive measures including mechanical ventilation or cardiac resuscitation for him and one sent to be comfortable. Does not want any artificial means of feeding. She does not have enough help to take him home for comfort. Discussed options for residual hospice and she agrees . Will consult social work. -Transfer patient out to medical floor. -When necessary Ativan for agitation and anxiety. When necessary morphine for dyspnea and comfort.   Acute Encephalopathy Secondary to sepsis. Patient lacks capacity  to make decisions.  Hypotension Secondary to underlying sepsis. Received aggressive IV fluids. Continue on maintenance fluids  NSTEMI Patient presented with chest pain with underlying CAD with CABG. No further chest pain symptoms. Troponin peaked at 0.19. Possibly demand ischemia from underlying sepsis and rapid A. fib Continue full dose aspirin. Holding beta blocker due to hypotension.  Patient is not a candidate for any intervention at this time. Does not need 2-D echo now that patient is comfort measures. Appreciate cardiology evaluation.   Atrial fibrillation -on aspirin. Not on anticoagulation given history of GI bleed on anticoagulation.   Anxiety - IV Ativan when necessary  Failure to thrive with severe protein calorie malnutrition and deconditioning Patient has poor by mouth intake for past several days with significant weight loss as per wife. Has severe deconditioning. Continues to feel swallow evaluation. Patient now made for comfort. Will provide comfort feeding.  Acute on chronic kidney disease stage III -worsened by underlying sepsis and prerenal with dehydration. Monitor with IV fluids. Avoid nephrotoxic.  DVT prophylaxis: Subcutaneous heparin  Diet: comfort feeds  Code Status: DO NOT RESUSCITATE, comfort measures Family Communication: Wife at bedside Disposition Plan: To medical floor with plan on residential hospice. Prognosis is guarded.   Consultants:  Audiology  Procedures:  2-D echo  Antibiotics:   IV vancomycin 6/17--  IV zosyn 6/18--  IV rocephin & azithromycin x1  HPI/Subjective: Patient agitated and irritable overnight. Blood pressure stable and afebrile. Wife at bedside who reports patient to be confused and irritable at times, asking for help and also getting at his wife asking her to take him home. He is telling his wife that shes conspiring to keep him in the hospital to die.  Objective: Filed Vitals:   06/21/14 1140  BP: 104/62   Pulse: 107  Temp: 97.2 F (36.2 C)  Resp: 25    Intake/Output Summary (Last 24 hours) at 06/21/14 1203 Last data filed at 06/21/14 1100  Gross per 24 hour  Intake   2950 ml  Output      0 ml  Net   2950 ml   Filed Weights   06/19/14 2300 06/21/14 0255  Weight: 52.1 kg (114 lb 13.8 oz) 52.5 kg (115 lb 11.9 oz)    Exam:   General:  Elderly cachectic male lying in bed appears restless  HEENT: Temporal wasting, no pallor, dry oral mucosa,   Chest: Diminished left-sided breath sounds, no rhonchi or wheeze  CVS: S1 and S2 irregular, no murmurs rub or gallop  GI: Soft, nondistended, nontender, bowel sounds present  Musculoskeletal: Warm, no edema,   CNS: Very hard of hearing, awake and alert but confused   Data Reviewed: Basic Metabolic Panel:  Recent Labs Lab 06/19/14 1725 06/20/14 0246 06/21/14 0851  NA 139 140 142  K 4.1 4.2 4.0  CL 94* 98* 106  CO2 32 32 28  GLUCOSE 99 91 96  BUN 63* 66* 71*  CREATININE 3.47* 3.36* 3.58*  CALCIUM 8.3* 7.8* 8.0*   Liver Function Tests:  Recent Labs Lab 06/19/14 1725  AST 38  ALT 19  ALKPHOS 64  BILITOT 1.9*  PROT 6.2*  ALBUMIN 2.6*   No results for input(s): LIPASE, AMYLASE in the last 168 hours. No results for input(s): AMMONIA in the last 168 hours. CBC:  Recent Labs Lab 06/19/14 2315 06/20/14 0246 06/21/14 0851  WBC 9.0 9.7 9.6  HGB 10.5* 10.6* 10.8*  HCT 33.7* 34.2* 35.6*  MCV 101.8* 102.1* 102.3*  PLT 143* 133* 151   Cardiac Enzymes:  Recent Labs Lab 06/19/14 1725 06/20/14 1000 06/20/14 1455 06/20/14 1930  TROPONINI 0.19* 0.17* 0.18* 0.16*   BNP (last 3 results)  Recent Labs  06/19/14 1725  BNP 666.4*    ProBNP (last 3 results) No results for input(s): PROBNP in the last 8760 hours.  CBG: No results for input(s): GLUCAP in the last 168 hours.  Recent Results (from the past 240 hour(s))  Blood Culture (routine x 2)     Status: None (Preliminary result)   Collection Time:  06/19/14  8:00 PM  Result Value Ref Range Status   Specimen Description BLOOD RIGHT ARM  Final   Special Requests BOTTLES DRAWN AEROBIC AND ANAEROBIC 5 CC  Final   Culture NO GROWTH < 24 HOURS  Final   Report Status PENDING  Incomplete  Blood Culture (routine x 2)     Status: None (Preliminary result)   Collection Time: 06/19/14  8:10 PM  Result Value Ref Range Status   Specimen Description BLOOD LEFT FOREARM  Final   Special Requests BOTTLES DRAWN AEROBIC AND ANAEROBIC 5 CC  Final   Culture  Setup Time   Final    GRAM POSITIVE COCCI IN CLUSTERS ANAEROBIC BOTTLE ONLY CRITICAL RESULT CALLED TO, READ BACK BY AND VERIFIED WITH: C.SOSA,RN @ 1405 ON [id number] BY Lucienne Capers    Culture   Final    STAPHYLOCOCCUS SPECIES (COAGULASE NEGATIVE) THE SIGNIFICANCE OF ISOLATING THIS ORGANISM FROM A SINGLE VENIPUNCTURE CANNOT BE PREDICTED WITHOUT FURTHER CLINICAL AND CULTURE CORRELATION. SUSCEPTIBILITIES AVAILABLE ONLY ON REQUEST.    Report Status PENDING  Incomplete  MRSA PCR Screening     Status: None   Collection Time: 06/19/14 11:19 PM  Result Value Ref Range Status   MRSA by PCR NEGATIVE NEGATIVE Final    Comment:        The GeneXpert MRSA Assay (FDA approved for NASAL specimens only), is one component of a comprehensive MRSA colonization surveillance program. It is not intended to diagnose MRSA infection nor to guide or monitor treatment for MRSA infections.   Urine culture     Status: None   Collection Time: 06/20/14  4:54 AM  Result Value Ref Range Status   Specimen Description URINE, CLEAN CATCH  Final   Special Requests NONE  Final   Culture   Final    MULTIPLE SPECIES PRESENT, SUGGEST RECOLLECTION IF CLINICALLY INDICATED   Report Status 06/21/2014 FINAL  Final     Studies: Dg Chest Port 1 View  06/19/2014   CLINICAL DATA:  Chest pain and cough with congestion  EXAM: PORTABLE CHEST - 1 VIEW  COMPARISON:  06/11/2009  FINDINGS: Moderate enlargement of cardiac silhouette is  reidentified with evidence of prior median sternotomy. Dense left lower lobe consolidation is present. Small left pleural effusion. Right lung is grossly clear but detail is obscured by overlying cardiac leads. No acute osseous abnormality.  IMPRESSION: Dense left lower lobe consolidation which could indicate pneumonia but would be best evaluated at PA and lateral chest radiographs when the patient is clinically able.   Electronically Signed   By: Christiana Pellant M.D.   On: 06/19/2014 18:48    Scheduled Meds: . antiseptic oral rinse  7 mL Mouth Rinse q12n4p  . aspirin  300 mg Rectal Daily  . chlorhexidine  15 mL Mouth Rinse BID  . enoxaparin (LOVENOX) injection  30 mg Subcutaneous Q24H  . piperacillin-tazobactam (ZOSYN)  IV  2.25 g Intravenous 3 times per day  . vancomycin  750 mg Intravenous Q48H   Continuous Infusions: . dextrose 5 % and 0.9% NaCl 75 mL/hr at 06/21/14 1100     Time spent: 35 minutes    Savior Himebaugh  [facility] Pager [phone]. If 7PM-7AM, please contact night-coverage at [email or web address], password [facility] 06/21/2014, 12:03 PM  LOS: 2 days

## 2014-06-22 LAB — LEGIONELLA ANTIGEN, URINE

## 2014-06-22 LAB — GLUCOSE, CAPILLARY: GLUCOSE-CAPILLARY: 64 mg/dL — AB (ref 65–99)

## 2014-06-22 NOTE — Plan of Care (Signed)
Problem: SLP Dysphagia Goals Goal: Patient will demonstrate readiness for PO's Patient will demonstrate readiness for PO's and/or instrumental swallow study as evidenced by:  Outcome: Not Met (add Reason) Pt not progressing, comfort feeds

## 2014-06-22 NOTE — Progress Notes (Signed)
Nutrition Brief Note  Chart reviewed. Pt with poor prognosis with continued poor po intake. Per MD note, wife does not want any aggressive measures including not wanting artifical means of feeding and wishes for pt to be comfortable. Pt is now on comfort feeds. No further nutrition interventions warranted at this time. Please re-consult as needed.   Roslyn Smiling, MS, RD, LDN Pager # [phone] After hours/ weekend pager # [phone]

## 2014-06-22 NOTE — Progress Notes (Signed)
[facility] PROGRESS NOTE  Krue Peterka ZOX:[id number] DOB: Oct 26, 1924 DOA: 06/19/2014 PCP: Dorrene German, MD  Discussed with wife with the help of bosnian  interpreter   Brief narrative 81 year old [place] male with history of CAD status post CABG, chronic A. fib not on anticoagulation due to history of GI bleed, CHF, hypertension, hyperlipidemia, stage IV CK D brought to the ED by his wife for chest pain for the past 3 days. Patient also has extremely poor appetite for past several days with failure to thrive and increased weakness to the point that he has not been getting out of bed for the last few days. Patient was found to be septic in the ED with a large left lower lobe infiltrate , hypotensive and tachycardic in rapid A. Fib. Patient also found to have acute on chronic kidney disease and elevated troponin. Patient admitted to stepdown unit for sepsis secondary to aspiration/left lobar pneumonia with NSTEMI. Patient now made full comfort given overall poor prognosis.  Assessment/Plan: Sepsis secondary to aspiration/left lower pneumonia with bacteremia -Continue empriric vancomycin and Zosyn. -1/2 blood cx growing coagulase negative staph, possibly a contaminant. -Prognosis is extremely guarded. Patient is severely deconditioned, malnourished with dysphagia. He is agitated and confused . -Discussed poor prognosis with wife with the help of the interpreter. She understands patient's progressive decline in symptoms. Does not want any aggressive measures. Unable to take care of him at home and always with residential hospice. -Social worker consulted. -When necessary Ativan for agitation and anxiety. When necessary morphine for dyspnea and comfort.   Acute Encephalopathy Secondary to sepsis. Patient lacks capacity to make decisions.  Hypotension Secondary to underlying sepsis. Received aggressive IV fluids.   NSTEMI Patient presented with chest pain with underlying CAD with  CABG.  Possibly demand ischemia from underlying sepsis and rapid A. fib Continue aspirin. No further intervention.  Atrial fibrillation -on aspirin. Not on anticoagulation given history of GI bleed on anticoagulation.   Anxiety - IV Ativan when necessary  Failure to thrive with severe protein calorie malnutrition and deconditioning Patient failing swallow evaluation. Severely deconditioned and malnourished. Now on comfort feeds.  Acute on chronic kidney disease stage III -worsened by underlying sepsis and prerenal with dehydration.   DVT prophylaxis: Subcutaneous heparin  Diet: comfort feeds  Code Status: DO NOT RESUSCITATE, comfort measures Family Communication: None at bedside Disposition Plan: Residential hospice.   Consultants:  Audiology  Procedures:  None  Antibiotics:   IV vancomycin 6/17--  IV zosyn 6/18--  IV rocephin & azithromycin x1  HPI/Subjective: Patient still confusion trying to get out of bed. unable to communicate properly.  Objective: Filed Vitals:   06/22/14 1029  BP: 96/43  Pulse: 80  Temp:   Resp: 15    Intake/Output Summary (Last 24 hours) at 06/22/14 1318 Last data filed at 06/22/14 0954  Gross per 24 hour  Intake 1879.5 ml  Output      0 ml  Net 1879.5 ml   Filed Weights   06/21/14 0255 06/21/14 1548 06/21/14 2017  Weight: 52.5 kg (115 lb 11.9 oz) 54.8 kg (120 lb 13 oz) 57.7 kg (127 lb 3.3 oz)    Exam:   General:  Elderly cachectic male lying in bed appears sleepy  HEENT: Temporal wasting,  dry oral mucosa,   Chest: Diminished left-sided breath sounds, no rhonchi or wheeze  CVS: S1 and S2 irregular,   GI: Soft, nondistended, nontender,   Musculoskeletal: Warm, no edema,   CNS: Arousable, poorly communicative.  Data Reviewed: Basic Metabolic Panel:  Recent Labs Lab 06/19/14 1725 06/20/14 0246 06/21/14 0851  NA 139 140 142  K 4.1 4.2 4.0  CL 94* 98* 106  CO2 32 32 28  GLUCOSE 99 91 96  BUN 63* 66*  71*  CREATININE 3.47* 3.36* 3.58*  CALCIUM 8.3* 7.8* 8.0*   Liver Function Tests:  Recent Labs Lab 06/19/14 1725  AST 38  ALT 19  ALKPHOS 64  BILITOT 1.9*  PROT 6.2*  ALBUMIN 2.6*   No results for input(s): LIPASE, AMYLASE in the last 168 hours. No results for input(s): AMMONIA in the last 168 hours. CBC:  Recent Labs Lab 06/19/14 2315 06/20/14 0246 06/21/14 0851  WBC 9.0 9.7 9.6  HGB 10.5* 10.6* 10.8*  HCT 33.7* 34.2* 35.6*  MCV 101.8* 102.1* 102.3*  PLT 143* 133* 151   Cardiac Enzymes:  Recent Labs Lab 06/19/14 1725 06/20/14 1000 06/20/14 1455 06/20/14 1930  TROPONINI 0.19* 0.17* 0.18* 0.16*   BNP (last 3 results)  Recent Labs  06/19/14 1725  BNP 666.4*    ProBNP (last 3 results) No results for input(s): PROBNP in the last 8760 hours.  CBG:  Recent Labs Lab 06/22/14 0757  GLUCAP 64*    Recent Results (from the past 240 hour(s))  Blood Culture (routine x 2)     Status: None (Preliminary result)   Collection Time: 06/19/14  8:00 PM  Result Value Ref Range Status   Specimen Description BLOOD RIGHT ARM  Final   Special Requests BOTTLES DRAWN AEROBIC AND ANAEROBIC 5 CC  Final   Culture NO GROWTH 2 DAYS  Final   Report Status PENDING  Incomplete  Blood Culture (routine x 2)     Status: None (Preliminary result)   Collection Time: 06/19/14  8:10 PM  Result Value Ref Range Status   Specimen Description BLOOD LEFT FOREARM  Final   Special Requests BOTTLES DRAWN AEROBIC AND ANAEROBIC 5 CC  Final   Culture  Setup Time   Final    GRAM POSITIVE COCCI IN CLUSTERS ANAEROBIC BOTTLE ONLY CRITICAL RESULT CALLED TO, READ BACK BY AND VERIFIED WITH: C.SOSA,RN @ 1405 ON [id number] BY Lucienne Capers    Culture   Final    STAPHYLOCOCCUS SPECIES (COAGULASE NEGATIVE) THE SIGNIFICANCE OF ISOLATING THIS ORGANISM FROM A SINGLE VENIPUNCTURE CANNOT BE PREDICTED WITHOUT FURTHER CLINICAL AND CULTURE CORRELATION. SUSCEPTIBILITIES AVAILABLE ONLY ON REQUEST.    Report Status  PENDING  Incomplete  MRSA PCR Screening     Status: None   Collection Time: 06/19/14 11:19 PM  Result Value Ref Range Status   MRSA by PCR NEGATIVE NEGATIVE Final    Comment:        The GeneXpert MRSA Assay (FDA approved for NASAL specimens only), is one component of a comprehensive MRSA colonization surveillance program. It is not intended to diagnose MRSA infection nor to guide or monitor treatment for MRSA infections.   Urine culture     Status: None   Collection Time: 06/20/14  4:54 AM  Result Value Ref Range Status   Specimen Description URINE, CLEAN CATCH  Final   Special Requests NONE  Final   Culture   Final    MULTIPLE SPECIES PRESENT, SUGGEST RECOLLECTION IF CLINICALLY INDICATED   Report Status 06/21/2014 FINAL  Final     Studies: No results found.  Scheduled Meds: . antiseptic oral rinse  7 mL Mouth Rinse q12n4p  . aspirin  300 mg Rectal Daily  . chlorhexidine  15 mL Mouth  Rinse BID  . enoxaparin (LOVENOX) injection  30 mg Subcutaneous Q24H  . piperacillin-tazobactam (ZOSYN)  IV  2.25 g Intravenous 3 times per day  . vancomycin  750 mg Intravenous Q48H   Continuous Infusions: . dextrose 5 % and 0.9% NaCl 75 mL/hr at 06/21/14 1219     Time spent: 25 minutes    Kaitlin Alcindor  [facility] Pager [phone]. If 7PM-7AM, please contact night-coverage at [email or web address], password [facility] 06/22/2014, 1:18 PM  LOS: 3 days

## 2014-06-22 NOTE — Clinical Documentation Improvement (Signed)
Presents with Sepsis, Septic Shock with Pneumonia, Encephalopathy. Pressure Ulcer is documented in 6/18 progress note.  Please clarify the site and stage of the patient's pressure ulcer and document findings in next progress note and include in discharge summary if applicable.  _______Other Condition__________________ _______Cannot Clinically Determine   Thank You, Shellee Milo ,RN Clinical Documentation Specialist:  [phone]  [facility]- Health Information Management

## 2014-06-22 NOTE — Progress Notes (Signed)
Speech Language Pathology Treatment: Dysphagia  Patient Details Name: Blake Arias MRN: [id number] DOB: June 22, 1924 Today's Date: 06/22/2014 Time: 6568-1275 SLP Time Calculation (min) (ACUTE ONLY): 8 min  Assessment / Plan / Recommendation Clinical Impression  Pt now on soft diet for comfort, but still not accepting or initiating intake with most PO attempts. SLP repositioned pt to maximize comfort but also oral control - elevated side lying. With max tactile cues pt accept a sip of water for the end of a straw and with a teaspoon with prolonged oral holding, eventually initiating swallow to cry out (pain?). Swallow continues to be severely impaired (multiple swallow, wet vocal quality). Pt spits out solid trials. Agree with plan for POs for comfort with known risk of aspiration, SLP will sign off given lack of progress.    HPI Other Pertinent Information: Kimball Steger is a 81 y.o. male from [place] with a history of CAD S/P CABG, Chronic Atrial Fibrillation, CHF, HTN, Hyperlipidemia, and Stage IV CKD who was brought to the ED due to complaints of Chest congestion, Cough and chest pain worsening over 3 days. He has had increased weakness and poor appetite as well .He was found to have a LLL Infiltrate on chest X-ray. Patient does not speak [place], primarly language Bosnian.   Pertinent Vitals    SLP Plan       Recommendations Diet recommendations: Dysphagia 1 (puree);Thin liquid Liquids provided via: Straw;Cup;Teaspoon Medication Administration: Via alternative means Supervision: Full supervision/cueing for compensatory strategies              Oral Care Recommendations: Oral care QID Follow up Recommendations: 24 hour supervision/assistance;Skilled Nursing facility    GO     Ferol Laiche, Riley Nearing 06/22/2014, 9:25 AM

## 2014-06-22 NOTE — Plan of Care (Signed)
Problem: Phase II Progression Outcomes Goal: Encourage coughing & deep breathing Outcome: Not Applicable Date Met:  91/44/45 Patient agitated ,kicking pillow off bed,medicated with painmeds

## 2014-06-22 NOTE — Progress Notes (Signed)
Patient attempting to get OOB.  He is sitting at the side of bed and will fall asleep and slump forward and then wake up.  [employer]preter again utilized.  Patient is still confused and calling for wife and to go home.  He did allow RN to hold his hand and sit beside him for awhile.  He wrote on a piece of paper but we were unable to interpret it.  He allowed [place] to lay him back down.  Will continue to monitor patient.  [employer] RN-BC, WTA.

## 2014-06-23 DIAGNOSIS — I214 Non-ST elevation (NSTEMI) myocardial infarction: Secondary | ICD-10-CM | POA: Diagnosis present

## 2014-06-23 DIAGNOSIS — R627 Adult failure to thrive: Secondary | ICD-10-CM | POA: Diagnosis present

## 2014-06-23 DIAGNOSIS — G9340 Encephalopathy, unspecified: Secondary | ICD-10-CM

## 2014-06-23 MED ORDER — MORPHINE SULFATE (CONCENTRATE) 10 MG /0.5 ML PO SOLN: 5.0000 mg | ORAL | Status: AC | PRN

## 2014-06-23 MED ORDER — LORAZEPAM 0.5 MG PO TABS: 0.5000 mg | ORAL_TABLET | ORAL | Status: AC | PRN

## 2014-06-23 NOTE — Consult Note (Signed)
[facility] Liaison: Received request from [place] for family interest in [facility]. Chart reviewed and spoke with Dr. Clementeen Graham. Met with spouse, her interpreter, CSW Jarrett Soho and RNCM Malachy Mood to explain services and answer questions. Spouse completed registration paper work in anticipation of room becoming available tomorrow. Dr. Orpah Melter to assume care per spouse's preference. Placed phone call to interpreter and spouse confirming room is available tomorrow. CSW and Dr. Clementeen Graham also made aware. Please fax discharge summary to [phone]. RN please call report to [phone]. Please arrange for Mr. Popwell to arrive before noon if possible. Thank you. Erling Conte LCSW [phone]

## 2014-06-23 NOTE — Progress Notes (Signed)
ANTIBIOTIC CONSULT NOTE - FOLLOW UP  Pharmacy Consult for vancomycin and zosyn Indication: Sepsis 2/2 to CAP  No Known Allergies  Patient Measurements: Height: 6' (182.9 cm) Weight: 130 lb 9.6 oz (59.24 kg) IBW/kg (Calculated) : 77.6   Vital Signs: Temp: 98.2 F (36.8 C) (06/21 0452) Temp Source: Oral (06/21 0452) BP: 104/57 mmHg (06/21 0921) Pulse Rate: 85 (06/21 0921) Intake/Output from previous day: 06/20 0701 - 06/21 0700 In: 1750 [I.V.:1500; IV Piggyback:250] Out: 175 [Urine:175] Intake/Output from this shift: Total I/O In: 525 [I.V.:525] Out: -   Labs:  Recent Labs  06/21/14 0851  WBC 9.6  HGB 10.8*  PLT 151  CREATININE 3.58*   Estimated Creatinine Clearance: 12.2 mL/min (by C-G formula based on Cr of 3.58). No results for input(s): VANCOTROUGH, VANCOPEAK, VANCORANDOM, GENTTROUGH, GENTPEAK, GENTRANDOM, TOBRATROUGH, TOBRAPEAK, TOBRARND, AMIKACINPEAK, AMIKACINTROU, AMIKACIN in the last 72 hours.    Assessment: 89 YOM on D#4 vancomycin and zosyn for Sepsis 2/2 to CAP, 1/2 blood culture is positive for CoNS, likely contamination. He is afebrile WBC wnl, LA 1.07, poor prognosis overall. Pending residential hospice placement.  6/18 Vanc >> 6/18 Zosyn >> 6/17 Azith >> 6/18 6/17 CTX >> 6/18  6/18 UCx >> neg 6/17 BCx x 2 >> CoNS (1/2)  Goal of Therapy:  Vancomycin trough level 15-20 mcg/ml  Plan:   - Vancomycin 750 mg IV Q48H - Zosyn 2.25 g IV Q8H - Monitor renal function - f/u stop date.   Bayard Hugger, PharmD, BCPS  Clinical Pharmacist  Pager: [phone]   06/23/2014,11:17 AM

## 2014-06-23 NOTE — Progress Notes (Signed)
GOC meeting in progress with [facility] Liaison for residential hospice referral. Patient wife is completing paperwork with pending bed at [facility].  Unclear if bed available today. Blake Arias is still completing assessment and will follow up.  Blake Arias, MSW Clinical Social Work: Emergency Room [phone]

## 2014-06-23 NOTE — Progress Notes (Signed)
OT Cancellation Note and Discharge  Patient Details Name: Blake Arias MRN: [id number] DOB: 1924/02/18   Cancelled Treatment:    Reason Eval/Treat Not Completed: Other (comment). Per internal med doc note from yesterday, pt is now comfort measures with residential hospice as goal. No acute OT needs at this time, we will sign off.  Evette Georges [phone] 06/23/2014, 7:47 AM

## 2014-06-23 NOTE — Progress Notes (Signed)
PT Cancellation Note and Discharge  Patient Details Name: Blake Arias MRN: [id number] DOB: 11/06/1924   Cancelled Treatment:    Reason Eval/Treat Not Completed: Other (comment)   Noted current goals of care are full comfort measures and plan is for residential hospice; no further acute PT needs noted;   Will sign off;  Van Clines, PT  Acute Rehabilitation Services Pager [phone] Office [phone]    Van Clines [facility] 06/23/2014, 1:26 PM

## 2014-06-23 NOTE — Discharge Summary (Signed)
 Physician Discharge Summary  Blake Arias:[id number] DOB: 1924/08/14 DOA: 06/19/2014  PCP: Dorrene German, MD  Admit date: 06/19/2014 Discharge date:   Time spent: 35 minutes  Recommendations for Outpatient Follow-up:  Discharged to residential hospice   Discharge Diagnoses:  Principal Problem:   Sepsis Active Problems:   Aspiration pneumonia   NSTEMI   Coronary atherosclerosis   Hypotension   HYPERCHOLESTEROLEMIA   Chronic atrial fibrillation   CKD (chronic kidney disease) stage 4, GFR 15-29 ml/min   AKI (acute kidney injury)   Elevated troponin   Pressure ulcer   Protein-calorie malnutrition, severe   Failure to thrive in adult   Discharge Condition: Guarded  Diet recommendation: Comfort feeds as tolerated  Filed Weights   06/21/14 1548 06/21/14 2017 06/22/14 2110  Weight: 54.8 kg (120 lb 13 oz) 57.7 kg (127 lb 3.3 oz) 59.24 kg (130 lb 9.6 oz)    History of present illness:  81 year old [place] male with history of CAD status post CABG, chronic A. fib not on anticoagulation due to history of GI bleed, CHF, hypertension, hyperlipidemia, stage IV CK D brought to the ED by his wife for chest pain for the past 3 days. Patient also has extremely poor appetite for past several days with failure to thrive and increased weakness to the point that he has not been getting out of bed for the last few days. Patient was found to be septic in the ED with a large left lower lobe infiltrate , hypotensive and tachycardic in rapid A. Fib. Patient also found to have acute on chronic kidney disease and elevated troponin. Patient admitted to stepdown unit for sepsis secondary to aspiration/left lobar pneumonia with NSTEMI. Patient now made full comfort given overall poor prognosis.  Hospital Course:  Sepsis secondary to aspiration/left lower pneumonia with bacteremia -Placed on empriric vancomycin and Zosyn. -1/2 blood cx growing coagulase negative staph, possibly a  contaminant. -Prognosis  extremely guarded. Patient is severely deconditioned, malnourished with dysphagia. He is agitated and confused . -Discussed poor prognosis with wife with the help of the interpreter. She understands patient's progressive decline in symptoms. Does not want any aggressive measures. Unable to take care of him at home and agrees with sending patient to residential hospice. -Added when necessary Ativan for agitation and anxiety. When necessary morphine solution for dyspnea and comfort. -patients expected survival is <4 weeks.   Acute Encephalopathy Secondary to sepsis. Patient lacks capacity to make decisions.  Hypotension Secondary to underlying sepsis. Received aggressive IV fluids.   NSTEMI Patient presented with chest pain with underlying CAD with CABG. Possibly demand ischemia from underlying sepsis and rapid A. fib Tinea and aspirin. No further intervention given progressive decline.   Atrial fibrillation -on aspirin at home. Not on anticoagulation given history of GI bleed on anticoagulation.   Anxiety Ativan as necessary.  Failure to thrive with severe protein calorie malnutrition and deconditioning Patient failing swallow evaluation. Severely deconditioned and malnourished. Now on comfort feeds.  Acute on chronic kidney disease stage III -worsened by underlying sepsis and prerenal with dehydration.     Code Status: DO NOT RESUSCITATE, comfort measures Family Communication: None at bedside. Spoke with wife on the phone Disposition Plan: Residential hospice.   Consultants:  Cardiology  Procedures:  None  Antibiotics:   IV vancomycin 6/17--  IV zosyn 6/18--  IV rocephin & azithromycin x1    Discharge Exam: Filed Vitals:   06/23/14 0921  BP: 104/57  Pulse: 85  Temp:   Resp:  General: Elderly cachectic male lying in bed appears sleepy, agitated and restless at times  HEENT: Temporal wasting, dry oral mucosa,    Chest: Diminished left-sided breath sounds, no rhonchi or wheeze  CVS: S1 and S2 irregular,   GI: Soft, nondistended, nontender,   Musculoskeletal: Warm, no edema,   CNS: Arousable, poorly communicative.  Discharge Instructions    Current Discharge Medication List    START taking these medications   Details  Morphine Sulfate (MORPHINE CONCENTRATE) 10 mg / 0.5 ml concentrated solution Take 0.25 mLs (5 mg total) by mouth every 4 (four) hours as needed for moderate pain, anxiety or shortness of breath. Qty: 15 mL, Refills: 0      CONTINUE these medications which have CHANGED   Details  LORazepam (ATIVAN) 0.5 MG tablet Take 1 tablet (0.5 mg total) by mouth every 4 (four) hours as needed for anxiety. Qty: 30 tablet, Refills: 0      CONTINUE these medications which have NOT CHANGED   Details  atenolol (TENORMIN) 50 MG tablet Take 1 tablet (50 mg total) by mouth daily. Qty: 90 tablet, Refills: 3   Associated Diagnoses: Pulmonary hypertension; Postsurgical aortocoronary bypass status; Chronic atrial fibrillation      STOP taking these medications     furosemide (LASIX) 40 MG tablet      Multiple Vitamins-Minerals (CENTRUM SILVER PO)      nitroGLYCERIN (NITROSTAT) 0.4 MG SL tablet      polyethylene glycol powder (GLYCOLAX/MIRALAX) powder      ferrous sulfate 325 (65 FE) MG tablet        No Known Allergies Follow-up Information    Please follow up.   Why:  residential hospice       The results of significant diagnostics from this hospitalization (including imaging, microbiology, ancillary and laboratory) are listed below for reference.    Significant Diagnostic Studies: Dg Chest Port 1 View  06/19/2014   CLINICAL DATA:  Chest pain and cough with congestion  EXAM: PORTABLE CHEST - 1 VIEW  COMPARISON:  06/11/2009  FINDINGS: Moderate enlargement of cardiac silhouette is reidentified with evidence of prior median sternotomy. Dense left lower lobe consolidation  is present. Small left pleural effusion. Right lung is grossly clear but detail is obscured by overlying cardiac leads. No acute osseous abnormality.  IMPRESSION: Dense left lower lobe consolidation which could indicate pneumonia but would be best evaluated at PA and lateral chest radiographs when the patient is clinically able.   Electronically Signed   By: Christiana Pellant M.D.   On: 06/19/2014 18:48    Microbiology: Recent Results (from the past 240 hour(s))  Blood Culture (routine x 2)     Status: None (Preliminary result)   Collection Time: 06/19/14  8:00 PM  Result Value Ref Range Status   Specimen Description BLOOD RIGHT ARM  Final   Special Requests BOTTLES DRAWN AEROBIC AND ANAEROBIC 5 CC  Final   Culture NO GROWTH 4 DAYS  Final   Report Status PENDING  Incomplete  Blood Culture (routine x 2)     Status: None (Preliminary result)   Collection Time: 06/19/14  8:10 PM  Result Value Ref Range Status   Specimen Description BLOOD LEFT FOREARM  Final   Special Requests BOTTLES DRAWN AEROBIC AND ANAEROBIC 5 CC  Final   Culture  Setup Time   Final    GRAM POSITIVE COCCI IN CLUSTERS ANAEROBIC BOTTLE ONLY CRITICAL RESULT CALLED TO, READ BACK BY AND VERIFIED WITH: C.SOSA,RN @ 1405 ON [id number]  BY Lucienne Capers    Culture   Final    STAPHYLOCOCCUS SPECIES (COAGULASE NEGATIVE) THE SIGNIFICANCE OF ISOLATING THIS ORGANISM FROM A SINGLE SET OF BLOOD CULTURES WHEN MULTIPLE SETS ARE DRAWN IS UNCERTAIN. PLEASE NOTIFY THE MICROBIOLOGY DEPARTMENT WITHIN ONE WEEK IF SPECIATION AND SENSITIVITIES ARE REQUIRED.    Report Status PENDING  Incomplete  MRSA PCR Screening     Status: None   Collection Time: 06/19/14 11:19 PM  Result Value Ref Range Status   MRSA by PCR NEGATIVE NEGATIVE Final    Comment:        The GeneXpert MRSA Assay (FDA approved for NASAL specimens only), is one component of a comprehensive MRSA colonization surveillance program. It is not intended to diagnose MRSA infection nor to  guide or monitor treatment for MRSA infections.   Urine culture     Status: None   Collection Time: 06/20/14  4:54 AM  Result Value Ref Range Status   Specimen Description URINE, CLEAN CATCH  Final   Special Requests NONE  Final   Culture   Final    MULTIPLE SPECIES PRESENT, SUGGEST RECOLLECTION IF CLINICALLY INDICATED   Report Status 06/21/2014 FINAL  Final     Labs: Basic Metabolic Panel:  Recent Labs Lab 06/19/14 1725 06/20/14 0246 06/21/14 0851  NA 139 140 142  K 4.1 4.2 4.0  CL 94* 98* 106  CO2 32 32 28  GLUCOSE 99 91 96  BUN 63* 66* 71*  CREATININE 3.47* 3.36* 3.58*  CALCIUM 8.3* 7.8* 8.0*   Liver Function Tests:  Recent Labs Lab 06/19/14 1725  AST 38  ALT 19  ALKPHOS 64  BILITOT 1.9*  PROT 6.2*  ALBUMIN 2.6*   No results for input(s): LIPASE, AMYLASE in the last 168 hours. No results for input(s): AMMONIA in the last 168 hours. CBC:  Recent Labs Lab 06/19/14 2315 06/20/14 0246 06/21/14 0851  WBC 9.0 9.7 9.6  HGB 10.5* 10.6* 10.8*  HCT 33.7* 34.2* 35.6*  MCV 101.8* 102.1* 102.3*  PLT 143* 133* 151   Cardiac Enzymes:  Recent Labs Lab 06/19/14 1725 06/20/14 1000 06/20/14 1455 06/20/14 1930  TROPONINI 0.19* 0.17* 0.18* 0.16*   BNP: BNP (last 3 results)  Recent Labs  06/19/14 1725  BNP 666.4*    ProBNP (last 3 results) No results for input(s): PROBNP in the last 8760 hours.  CBG:  Recent Labs Lab 06/22/14 0757  GLUCAP 64*       Signed:  Sota Hetz  [facility] 06/23/2014, 4:17 PM

## 2014-06-23 NOTE — Care Management Note (Signed)
Case Management Note  Patient Details  Name: Blake Arias MRN: [id number] Date of Birth: January 10, 1924  Subjective/Objective:             CM following for progression and d/c planning.       Action/Plan: CSW working with pt and family re placement in residential hospice facility.  Expected Discharge Date:       0/22/2016           Expected Discharge Plan:  Hospice Medical Facility  In-House Referral:  Clinical Social Work  Discharge planning Services  NA  Post Acute Care Choice:  NA Choice offered to:  NA  DME Arranged:    DME Agency:     HH Arranged:    HH Agency:     Status of Service:  Completed, signed off  Medicare Important Message Given:  Yes Date Medicare IM Given:  06/23/14 Medicare IM give by:  Johny Shock RN MPH, case manager, [phone] Date Additional Medicare IM Given:    Additional Medicare Important Message give by:     If discussed at Long Length of Stay Meetings, dates discussed:    Additional Comments:  Starlyn Skeans, RN 06/23/2014, 11:05 AM

## 2014-06-23 NOTE — Clinical Social Work Note (Signed)
Clinical Social Work Assessment  Patient Details  Name: Blake Arias MRN: [id number] Date of Birth: [birth year]  Date of referral:  06/23/14               Reason for consult:  End of Life/Hospice, Discharge Planning, Emotional/Coping/Adjustment to Illness                Permission sought to share information with:  Case Manager, Magazine features editor, Family Supports Permission granted to share information::  Yes, Release of Information Signed  Name::     [facility], H[facility] ([facility])  Agency::  See above  Relationship::  Wife  Contact Information:     Housing/Transportation Living arrangements for the past 2 months:  Single Family Home Source of Information:  Medical Team, Case Manager, Spouse Patient Interpreter Needed:  Other (Comment Required) Art gallery manager) Criminal Activity/Legal Involvement Pertinent to Current Situation/Hospitalization:  No - Comment as needed Significant Relationships:  Other Family Members, Friend, Spouse Lives with:  Spouse Do you feel safe going back to the place where you live?  No (needing residental hospice care) Need for family participation in patient care:  Yes (Comment) (wife making decisions)  Care giving concerns:  Patient's current medical and physical condition has decompensated to the point where wife has decided to provide all comfort measures.  She reports she cannot take care of him at home and is wanting residential hospice care for patient.  Prior to admission, patient living at home with wife.   Social Worker assessment / plan:  LCSW received consult and spoke with MD regarding care for patient and disposotion.  MD had conversation using interpreter phone with patient's wife on 6/20 regarding goals of care and wife choosing comfort at this time.  6/21 LCSW called patient wife as she was not in room with interpreter in effort to discuss goals of care and plans at DC.  Wife again agreeable for residental hospice, offered choice with  choice being [facility].  Call/text placed to Forrestine Him who is aware of referral and going to work patient up.  No beds today as [facility] is full however she will be in contact with this writer if bed is available this week or tomorrow.  If no bed available, patient will be referred to [facility] as this is the second choice. Wife reports she will be coming to the hospital today around 1:00 with a friend.  Will speak with wife once she arrives again about plan and if any new information has been gathered from referrals.    Plan at this time is residential hospice.  LCSW actively working with family on placement.  Patient unable to participate in assessment as he is not oriented and at time aggressive.  Employment status:  Retired Health and safety inspector:  Armed forces operational officer, Medicaid In [place] PT Recommendations:  24 Hour Supervision Information / Referral to community resources:  Other (Comment Required) [facility] )  Patient/Family's Response to care:  Agreeable to plan  Patient/Family's Understanding of and Emotional Response to Diagnosis, Current Treatment, and Prognosis:  Wife emotional and guarded when talking about husband and still understanding his condition and current EOL care/comfort care.  Has strong support from friend who will be with family this afternoon to assist and support.  Emotional Assessment Appearance:  Appears stated age Attitude/Demeanor/Rapport:  Unresponsive, Lethargic, Complaining Affect (typically observed):  Withdrawn, Agitated, Anxious Orientation:  Oriented to Self Alcohol / Substance use:  Not Applicable Psych involvement (Current and /or in the community):  No (Comment)  Discharge Needs  Concerns to be addressed:  Grief and Loss Concerns, Decision making concerns, Discharge Planning Concerns Readmission within the last 30 days:  No Current discharge risk:  None Barriers to Discharge:  Continued Medical Work up, Other (referral for hospice  home)   Cordella Register 06/23/2014, 11:53 AM

## 2014-06-24 LAB — CULTURE, BLOOD (ROUTINE X 2): Culture: NO GROWTH

## 2014-06-24 NOTE — Consult Note (Addendum)
WOC wound consult note Reason for Consult: Consult requested for deep tissue injury to left elbow and left hip. Pt is very emeciated. Wound type: Appearance and location are more consistent with bruises, instead of a deep tissue injury.  Left hip injury is located in a crease and not over a bony prominence and both the elbow and hip are irregular in shape; pressure ulcers usually have well defined boarders.  Left inner hip area 6X10cm irregular-shaped dark purple with intact skin.  Left elbow with several dark purple areas, irregular-shaped with intact skin; 3X8cm. Pt has a stage 2 wound to sacrum 1X1X.25 cm and is frequently incontinent of urine; dressing would become soiled and trap moisture against skin; barrier cream is most appropriate for protection of this site. Dressing procedure/placement/frequency: No topical treatment is indicated at this time since skin is intact over bruised areas.  Pt has comfort care goals according to the EMR. No family present to discuss plan of care and pt has language barrier. Please re-consult if further assistance is needed.  Thank-you,  Cammie Mcgee MSN, RN, CWOCN, [place], CNS [phone]

## 2014-06-24 NOTE — Clinical Social Work Note (Signed)
Clinical Social Worker facilitated patient discharge including contacting patient family and facility to confirm patient discharge plans.  Clinical information faxed to facility and family agreeable with plan.  CSW arranged ambulance transport via [facility] to [facility].  RN to call report prior to discharge.  Clinical Social Worker will sign off for now as social work intervention is no longer needed. Please consult us again if new need arises.  Jesse Khylen Riolo, LCSW [phone] 

## 2014-06-24 NOTE — Progress Notes (Signed)
 Craig Patnode to be D/C'd [facility] per MD order. Called report to facility RN.     Medication List    STOP taking these medications        CENTRUM SILVER PO     ferrous sulfate 325 (65 FE) MG tablet     furosemide 40 MG tablet  Commonly known as:  LASIX     nitroGLYCERIN 0.4 MG SL tablet  Commonly known as:  NITROSTAT     polyethylene glycol powder powder  Commonly known as:  GLYCOLAX/MIRALAX      TAKE these medications        atenolol 50 MG tablet  Commonly known as:  TENORMIN  Take 1 tablet (50 mg total) by mouth daily.     LORazepam 0.5 MG tablet  Commonly known as:  ATIVAN  Take 1 tablet (0.5 mg total) by mouth every 4 (four) hours as needed for anxiety.     morphine CONCENTRATE 10 mg / 0.5 ml concentrated solution  Take 0.25 mLs (5 mg total) by mouth every 4 (four) hours as needed for moderate pain, anxiety or shortness of breath.        Filed Vitals:    0822  BP: 91/47  Pulse: 56  Temp: 97.5 F (36.4 C)  Resp: 16    IV catheter discontinued intact. Site without signs and symptoms of complications. Dressing and pressure applied. Pt denies pain at this time. No complaints noted.  Patient escorted via Doctor, general practice and D/C [facility] via [place].  Zyhir Cappella A  12:37 PM

## 2014-06-24 NOTE — Progress Notes (Signed)
 Patient seen and examined.  Please see full discharge summary dictated on 06/23/2014 by Dr. Theda Belfast Dhungel.  No change.  Patient will be discharged to residential hospice.  Continue medications as ordered for discharge.     Time spent: 10 minutes  Indiya Izquierdo D.O. [facility] Pager [phone]  If 7PM-7AM, please contact night-coverage [email or web address] Password [facility] , 10:05 AM

## 2014-06-25 LAB — CULTURE, BLOOD (ROUTINE X 2)

## 2014-07-03 DEATH — deceased

## 2014-07-27 ENCOUNTER — Telehealth: Payer: Self-pay | Admitting: Cardiology

## 2014-07-27 NOTE — Telephone Encounter (Signed)
She wanted Dr Swaziland to know he passed away on 07/11/14.

## 2014-07-27 NOTE — Telephone Encounter (Signed)
Will forward this to Dr. [place].
